# Patient Record
Sex: Male | Born: 1955 | Race: White | Hispanic: No | Marital: Single
Health system: Midwestern US, Academic
[De-identification: ages and names within clinical notes are randomized; demographics above are authoritative.]

---

## 2021-01-30 ENCOUNTER — Encounter: Admit: 2021-01-30 | Discharge: 2021-01-30 | Payer: Commercial Managed Care - PPO

## 2021-01-30 DIAGNOSIS — I350 Nonrheumatic aortic (valve) stenosis: Secondary | ICD-10-CM

## 2021-02-01 ENCOUNTER — Encounter: Admit: 2021-02-01 | Discharge: 2021-02-01 | Payer: MEDICARE

## 2021-02-01 DIAGNOSIS — E669 Obesity, unspecified: Secondary | ICD-10-CM

## 2021-02-01 DIAGNOSIS — E039 Hypothyroidism, unspecified: Secondary | ICD-10-CM

## 2021-02-01 DIAGNOSIS — I350 Nonrheumatic aortic (valve) stenosis: Secondary | ICD-10-CM

## 2021-02-01 DIAGNOSIS — I10 Essential (primary) hypertension: Secondary | ICD-10-CM

## 2021-02-01 DIAGNOSIS — I2510 Atherosclerotic heart disease of native coronary artery without angina pectoris: Secondary | ICD-10-CM

## 2021-02-06 ENCOUNTER — Ambulatory Visit: Admit: 2021-02-06 | Discharge: 2021-02-06 | Payer: MEDICARE

## 2021-02-06 ENCOUNTER — Encounter: Admit: 2021-02-06 | Discharge: 2021-02-06 | Payer: MEDICARE

## 2021-02-06 DIAGNOSIS — I2510 Atherosclerotic heart disease of native coronary artery without angina pectoris: Secondary | ICD-10-CM

## 2021-02-06 DIAGNOSIS — I10 Essential (primary) hypertension: Secondary | ICD-10-CM

## 2021-02-06 DIAGNOSIS — I350 Nonrheumatic aortic (valve) stenosis: Secondary | ICD-10-CM

## 2021-02-06 DIAGNOSIS — E669 Obesity, unspecified: Secondary | ICD-10-CM

## 2021-02-06 DIAGNOSIS — E039 Hypothyroidism, unspecified: Secondary | ICD-10-CM

## 2021-02-06 DIAGNOSIS — Z136 Encounter for screening for cardiovascular disorders: Secondary | ICD-10-CM

## 2021-02-06 LAB — POC CREATININE, RAD: CREATININE, POC: 1.1 mg/dL (ref 0.4–1.24)

## 2021-02-06 MED ORDER — IOHEXOL 350 MG IODINE/ML IV SOLN
100 mL | Freq: Once | INTRAVENOUS | 0 refills | Status: CP
Start: 2021-02-06 — End: ?
  Administered 2021-02-06: 16:00:00 100 mL via INTRAVENOUS

## 2021-02-06 MED ORDER — SODIUM CHLORIDE 0.9 % IJ SOLN
50 mL | Freq: Once | INTRAVENOUS | 0 refills | Status: CP
Start: 2021-02-06 — End: ?
  Administered 2021-02-06: 16:00:00 50 mL via INTRAVENOUS

## 2021-02-06 NOTE — Progress Notes
Date of Service: 02/06/2021       Subjective:             Robert Butler is a 66 y.o. male.      History of Present Illness  We had the pleasure of seeing Robert Butler in the valve clinic for further management of aortic stenosis and coronary artery disease.  He is a 66 year old male who follows with outside cardiologist who was noted to have progressive aortic stenosis.  He also has known hypertension, coronary artery disease, and obesity.  As part of his work-up for aortic stenosis he underwent echo Doppler on 12/01/2020 which revealed an EF of 70%, severe aortic stenosis with a mean gradient 61 mmHg, AVA 0.53, peak velocity 5.23 m/s with mild to moderate mitral regurgitation.  He was referred for TEE which was completed on 11/30 and revealed severe aortic stenosis with a valve area 0.75 cm?, moderate mitral regurgitation with preserved LV function of 70%.  Heart catheterization was also completed on 11/30 which revealed multivessel coronary artery disease including 70% mid RCA disease, 70 to 80% circumflex disease and 90% LAD disease.  He was evaluated by outside CTS surgeon who recommended surgical aortic valve replacement and CABG and he presents to [facility] for second opinion.    Today he reports that he notes chest tightness with exertion which typically resolves with rest.  He also notes some mild shortness of breath with exertion but feels the symptoms may be more noticeable since his recent diagnosis.  He denies orthopnea, PND, palpitations, dizziness, lightheadedness, or near syncope.  He admits that he is not overly active but does still do some activities like [place] as well as fishing.  He also reports that he retired about 1 year ago and does spend a lot of time with his grandchildren.       Review of Systems   Constitutional: Negative.   HENT: Negative.    Eyes: Negative.    Cardiovascular: Negative.    Respiratory: Positive for shortness of breath (Upon exertion ).    Endocrine: Negative.    Hematologic/Lymphatic: Negative.    Skin: Negative.    Musculoskeletal: Negative.    Gastrointestinal: Negative.    Genitourinary: Negative.    Neurological: Negative.    Psychiatric/Behavioral: Negative.    Allergic/Immunologic: Negative.          Objective:         ? amLODIPine (NORVASC) 5 mg tablet Take 5 mg by mouth daily.   ? aspirin EC 81 mg tablet Take 81 mg by mouth daily. Take with food.   ? CHOLEcalciferoL (vitamin D3) (VITAMIN D3) 50 mcg (2,000 unit) tablet Take 2,000 Units by mouth daily.   ? hydroCHLOROthiazide (HYDRODIURIL) 25 mg tablet Take 25 mg by mouth every morning.   ? levothyroxine (SYNTHROID) 50 mcg tablet Take 50 mcg by mouth daily 30 minutes before breakfast.   ? metoprolol succinate XL (TOPROL XL) 50 mg extended release tablet Take 50 mg by mouth daily.   ? multivitamin/iron/folic acid (CENTRUM COMPLETE PO) Take  by mouth daily.   ? rosuvastatin (CRESTOR) 20 mg tablet Take 20 mg by mouth daily.     Vitals:    02/06/21 1038   BP: 130/84   BP Source: Arm, Right Upper   Pulse: 72   SpO2: 96%   PainSc: Zero   Weight: 136.1 kg (300 lb)   Height: 177.8 cm (5' 10)     Body mass index is  43.05 kg/m?Marland Kitchen     Medical History:   Diagnosis Date   ? Aortic stenosis    ? Coronary artery disease    ? Hypertension    ? Hypothyroidism    ? Obesity      Surgical History:   Procedure Laterality Date   ? FINGER SURGERY       History reviewed. No pertinent family history.  Social History     Socioeconomic History   ? Marital status: Single   Tobacco Use   ? Smoking status: Never   ? Smokeless tobacco: Never   Substance and Sexual Activity   ? Alcohol use: Yes   ? Drug use: Never                   Physical Exam  General Appearance: no acute distress  Skin: warm & intact  HEENT: unremarkable  Neck Veins: neck veins are flat & not distended  Carotid Arteries: no bruits  Chest Inspection: chest is normal in appearance  Auscultation/Percussion: lungs clear to auscultation, no rales, rhonchi, or wheezing  Cardiac Rhythm: regular rhythm & normal rate  Cardiac Auscultation: Normal S1 & S2, no S3 or S4, no rub  Murmurs: 3/6 systolic murmur  Extremities: no lower extremity edema  Abdominal Exam: soft, non-tender, no masses, bowel sounds normal  Liver & Spleen: no organomegaly  Neurologic Exam: oriented to time, place and person; no focal neurologic deficits  Psychiatric: Normal mood and affect.  Behavior is normal. Judgment and thought content normal.            Assessment and Plan:  Severe aortic stenosis  His imaging was reviewed by Dr. Paris Lore and Dr. Helen Hashimoto.  He does have evidence of severe aortic stenosis.  They feel given his combined severe aortic stenosis and significant coronary disease he would be better served with open surgical aortic valve replacement and CABG.    Coronary artery disease  Recent cath revealed multivessel coronary artery disease.  As above would recommend to proceed with open AVR and CABG.    Chronic diastolic heart failure  He appears well compensated today on exam.  He describes NYHA class II heart failure symptoms.    Hypertension    Class III severe obesity    We appreciate the opportunity to participate in his care.    Utilizing a shared decision-making process, we discussed the nature and progression of valvular heart disease, goals of care, and treatment options including open surgery, transcatheter approach and conservative management and the risks and benefits of each option.      Dorena Cookey, APRN  Pager [phone]  ____________________________________________________________  I had a good conversation with Robert Butler in the valve clinic today concerning his aortic stenosis and multivessel coronary disease.  He is a 66 year old gentleman seeking second opinion from [place] area after being diagnosed with severe aortic stenosis.  He has a bicuspid aortic valve with an EF of 70% and a mean gradient of 61 with a valve area 1.53 cm?.  His peak velocity is 5.2 m/s.    On cath he has multivessel coronary disease with a 70% mid RCA, 80% OM1 lesion and a 90% LAD.  He was recommended open surgery therefore it was seeking second opinion for possible TAVR PCI.    I reviewed the films with Dr. Paris Lore and discussed extensively with the patient.  We did offer him both options.  We do feel that given the data for long-term outcomes with multivessel coronary disease that  PCI TAVR was slightly inferior to open surgical valve replacement and coronary bypass surgery.  We discussed valve choices and recommended a tissue valve and he agrees.  We reviewed the risks of the procedure including bleeding, infection, stroke, dialysis and death.  He has expressed understanding and is willing to proceed.  We are obtaining the remainder of his preoperative studies and working on getting him scheduled for an open procedure.  We also discussed ligating his left atrial appendage at that time and he is comfortable with that.  We will keep you updated on his progress.    Sindy Messing, M.D.

## 2021-02-07 ENCOUNTER — Encounter: Admit: 2021-02-07 | Discharge: 2021-02-07 | Payer: MEDICARE

## 2021-02-07 DIAGNOSIS — Q231 Congenital insufficiency of aortic valve: Secondary | ICD-10-CM

## 2021-02-07 DIAGNOSIS — I350 Nonrheumatic aortic (valve) stenosis: Secondary | ICD-10-CM

## 2021-02-07 DIAGNOSIS — I2510 Atherosclerotic heart disease of native coronary artery without angina pectoris: Secondary | ICD-10-CM

## 2021-02-08 ENCOUNTER — Encounter: Admit: 2021-02-08 | Discharge: 2021-02-08 | Payer: MEDICARE

## 2021-02-08 ENCOUNTER — Inpatient Hospital Stay: Admit: 2021-02-08 | Discharge: 2021-02-08 | Payer: MEDICARE

## 2021-02-08 DIAGNOSIS — I350 Nonrheumatic aortic (valve) stenosis: Secondary | ICD-10-CM

## 2021-02-08 DIAGNOSIS — I2510 Atherosclerotic heart disease of native coronary artery without angina pectoris: Secondary | ICD-10-CM

## 2021-02-08 DIAGNOSIS — Q231 Congenital insufficiency of aortic valve: Secondary | ICD-10-CM

## 2021-02-08 MED ORDER — METOPROLOL TARTRATE 25 MG PO TAB
12.5 mg | Freq: Once | ORAL | 0 refills
Start: 2021-02-08 — End: ?

## 2021-02-08 MED ORDER — SODIUM CHLORIDE 0.9 % IV SOLP
INTRAVENOUS | 0 refills
Start: 2021-02-08 — End: ?

## 2021-02-08 MED ORDER — LIDOCAINE (PF) 10 MG/ML (1 %) IJ SOLN
.2 mL | INTRAMUSCULAR | 0 refills | PRN
Start: 2021-02-08 — End: ?

## 2021-02-09 ENCOUNTER — Encounter: Admit: 2021-02-09 | Discharge: 2021-02-09 | Payer: MEDICARE

## 2021-02-09 NOTE — Telephone Encounter
Called patient to discuss PCI TAVR. Patient initially stated he would need to wait until after the SuperBowl to have PCI. He asked that I call Monday so he can think about dates.

## 2021-02-12 ENCOUNTER — Encounter: Admit: 2021-02-12 | Discharge: 2021-02-12 | Payer: MEDICARE

## 2021-02-12 DIAGNOSIS — I2510 Atherosclerotic heart disease of native coronary artery without angina pectoris: Secondary | ICD-10-CM

## 2021-02-12 DIAGNOSIS — I350 Nonrheumatic aortic (valve) stenosis: Secondary | ICD-10-CM

## 2021-02-12 MED ORDER — ACETAMINOPHEN 325 MG PO TAB
650 mg | ORAL | 0 refills | PRN
Start: 2021-02-12 — End: ?

## 2021-02-12 MED ORDER — NITROGLYCERIN 0.4 MG SL SUBL
.4 mg | SUBLINGUAL | 0 refills | PRN
Start: 2021-02-12 — End: ?

## 2021-02-12 MED ORDER — ALUMINUM-MAGNESIUM HYDROXIDE 200-200 MG/5 ML PO SUSP
30 mL | ORAL | 0 refills | PRN
Start: 2021-02-12 — End: ?

## 2021-02-12 MED ORDER — ASPIRIN 325 MG PO TAB
325 mg | Freq: Once | ORAL | 0 refills
Start: 2021-02-12 — End: ?

## 2021-02-12 MED ORDER — DOCUSATE SODIUM 100 MG PO CAP
100 mg | Freq: Every day | ORAL | 0 refills | PRN
Start: 2021-02-12 — End: ?

## 2021-02-12 MED ORDER — TEMAZEPAM 15 MG PO CAP
15 mg | Freq: Every evening | ORAL | 0 refills | PRN
Start: 2021-02-12 — End: ?

## 2021-02-12 NOTE — Progress Notes
CBC & BMP orders faxed to [facility] at [phone].

## 2021-02-12 NOTE — Patient Instructions
CARDIAC CATHETERIZATION   PRE-ADMISSION INSTRUCTIONS    Patient Name: Robert Butler  MRN#: [id number]  Date of Birth: 10/06/1955 (66 y.o.)  Today's Date: 02/12/2021    PROCEDURE:  You are scheduled for a Coronary Angiogram with possible Angioplasty/Stenting with Dr. Idamae Lusher. Please plan on a possible overnight stay in the hospital.       PROCEDURE DATE AND ARRIVAL TIME:  Your procedure date is 03/14/21.   Please check in to the Cardiovascular Medicine office for an updated history & physical visit with Dorena Cookey, APRN at 8:00 a.m.    You will have Pulmonary Function Test at 9:00 a.m. in the Medical Office [place].    You will receive a call from the Cath lab staff between 8:00 a.m. and noon on the business day prior to your procedure to let you know at what time to arrive on the day of your procedure.    Please check in at the Admitting Desk in the [facility] for your procedure. [facility] Entrance and and take a right. Continue down the hallway past the Cardiovascular Medicine office. That hall will take you into the [facility]. Check in at the desk on the left side.)     (If you have further questions regarding your arrival time for the CV lab, please call [phone] by 3:00pm the day before your procedure. Please leave a message with your name and number, your call will be returned in a timely manner.)    PRE-PROCEDURE APPOINTMENTS:    03/14/21 at 8:00   Office visit to update history and physical (requirement within 30 days of procedure)  with  Dorena Cookey, APRN  at Cardiovascular Medicine [facility]       Between 02/28/21 - 03/07/21    Pre-Admission lab work required within 14 days of procedure: BMP and CBC  [facility]       FOOD AND DRINK INSTRUCTIONS  Nothing to eat after midnight before your procedure. No caffeine for 24 hours prior to your procedure. You will be under moderate sedation for your procedure.  You may drink clear liquids up to an hour before hospital arrival. This will be confirmed by the Cath lab staff the day before your procedure.     SPECIAL MEDICATION INSTRUCTIONS  Any new prescriptions will be sent to your pharmacy listed on file with [place].     Please either take 4 baby aspirins (4 times 81mg ) or one full strength 325mg  aspirin the morning of your procedure.    Diuretics: HCTZ (hydrochlorothiazide) -- hold the morning of your procedure.      HOLD ALL erectile dysfunction medications for 3 days, unless prescribed for pulmonary hypertension.    HOLD ALL over the counter vitamins or supplements on the morning of your procedure.      Additional Instructions  If you wear CPAP, please bring your mask and machine with you to the hospital.    Take a bath or shower with anti-bacterial soap the evening before, or the morning of the procedure.     Bring photo ID and your health insurance card(s).    Arrange for a driver to take you home from the hospital. Please arrange for a friend or family member to take you home from this test. You cannot take a Taxi, [employer] Goad, or public transportation as there has to be a responsible person to help care for you after sedation    Bring an accurate list of your current medications with you  to the hospital (all medications and supplements taken daily).  Please use the medication list below and write in the date and time when you took your last dose before your procedure. Update this list of medications as needed.      Wear comfortable clothes and don't bring valuables, other than photo identification card, with you to the hospital.    Please pack a bag for an overnight stay.     Please review your pre-procedure instructions and bring them with you on the day of your procedure.    Please reach out to valve clinic nurses, Aletheia Tangredi or [place] with questions: [phone].    ALLERGIES  No Known Allergies    CURRENT MEDICATIONS  Outpatient Encounter Medications as of 02/12/2021   Medication Sig Dispense Refill    amLODIPine (NORVASC) 5 mg tablet Take 5 mg by mouth daily.      aspirin EC 81 mg tablet Take 81 mg by mouth daily. Take with food.      CHOLEcalciferoL (vitamin D3) (VITAMIN D3) 50 mcg (2,000 unit) tablet Take 2,000 Units by mouth daily.      hydroCHLOROthiazide (HYDRODIURIL) 25 mg tablet Take 25 mg by mouth every morning.      levothyroxine (SYNTHROID) 50 mcg tablet Take 50 mcg by mouth daily 30 minutes before breakfast.      metoprolol succinate XL (TOPROL XL) 50 mg extended release tablet Take 50 mg by mouth daily.      multivitamin/iron/folic acid (CENTRUM COMPLETE PO) Take  by mouth daily.      rosuvastatin (CRESTOR) 20 mg tablet Take 20 mg by mouth daily.       No facility-administered encounter medications on file as of 02/12/2021.       _________________________________________  Form completed by: Cameron Sprang, RN  Date completed: 02/12/21  Method: Via telephone and mailed to the patient.        Coronary Angiography  Angiography is a special type of moving X-ray that lets your doctor view your coronary arteries to see if the blood vessels to your heart are narrowed or blocked. This test is done when someone is having a heart attack. Or it may be done if symptoms may mean a heart attack. It also may be done after an abnormal cardiac stress test.    Before the procedure   Tell your healthcare team what medicines you take and any allergies you may have.  Tell your healthcare team if you've had a reaction to contrast dye or have had any kidney problems.  Follow any directions you are given for not eating or drinking before surgery.  A nurse will place an IV (intravenous) catheter in your vein to give fluids, and medicine to relieve pain and help you feel less anxious.  They clean your skin and shave the area where the catheter will be inserted, if needed.    During the procedure   You will lie on a table with a portable X-ray machine over you. The team will place a surgical drape over your body. The area where the doctor chooses to insert the catheter will be cleaned. This will be either a wrist or the groin.  Your doctor will place a long, thin tube (catheter) inside an artery in your groin or arm and guide it into your heart. You may feel pressure with the insertion of the catheter. A numbing medicine often is injected at the insertion site. This eases discomfort during the procedure.  They will inject a contrast  dye through the catheter into your blood vessels or heart chambers. You may feel a warm sensation or feeling like you have to urinate when the contrast is injected. This is normal.  X-rays are taken to show images of the inside of your heart and coronary arteries.     The catheter can be placed into the groin, arm, or wrist.     After the procedure   Your healthcare team will tell you how long to lie down and keep the insertion site still. The amount of time may depend on whether a closure device such as a stitch or collagen plug was used to close the opening made in your artery. The time you must be still may be shorter if one of these devices was used. The amount of time will also depend on if there is any bleeding at the catheter insertion site.  If the insertion site was in your groin, you may need to lie down with your leg still for several hours. If the insertion site was in your wrist, a pressure bandage may be put on the site. Or you may have closure device placed on the insertion site. It will be taken off when there is no sign of bleeding. If bleeding occurs, a nurse will put pressure on the area to control it.  A nurse will check your blood pressure and the insertion site often. This is to make sure you remain stable after the procedure.  You may be asked to drink fluid to help flush the contrast liquid out of your system.  Have someone drive you home from the hospital.  If your doctor uses angioplasty or a stent to treat a blocked artery, you may stay the night in the hospital. If there are multiple blockages that can't be fixed with a stent or angioplasty, you may need surgery to bypass the blockages. This is called coronary artery bypass graft surgery. Your doctor will explain the results of your test and what treatment options that may be best for you.  It?s normal to find a small bruise or lump at the insertion site. The lump may be the collagen plug or stitch that you feel, or a small bruise. These common side effects should disappear within a few weeks.  You will be given instructions by your healthcare team on recovering from the coronary angiography. In general, don't lift anything heavier than a gallon of milk for several days. This gives time for the puncture site in the artery wall to heal. Try not to get the puncture site wet. Don't put it under water. Showers are OK. Don't soak in a bathtub, swimming pool, or hot tub until the skin has healed.    When to call your healthcare provider   Call your healthcare provider right away if you have any of these:   Symptoms of infection. These include pain, swelling, redness, bleeding, or drainage at the insertion site.  Fever of 100.4?F (38?C) or higher, or as advised by your provider  Bleeding, bruising, or a lot of swelling where the catheter was inserted  Blood in your urine  Black or tarry stools  Any unusual bleeding  Irregular, very slow, or fast heartbeat  Dizziness  Call 911  Call 911if any of these occur:     Chest pain  Shortness of breath  Sudden numbness or weakness in arms, legs, or face, or difficulty speaking  The puncture site swells up very fast  Bleeding from the puncture site that  does not slow down with firm pressure  Severe or increasing pain, numbness, coldness, or a bluish color in the leg or arm that held the catheter    [employer] last reviewed this educational content on 02/22/2020  ? 2000-2022 [employer], [place]. All rights reserved. This information is not intended as a substitute for professional medical care. Always follow your healthcare professional's instructions.

## 2021-02-14 ENCOUNTER — Encounter: Admit: 2021-02-14 | Discharge: 2021-02-14 | Payer: MEDICARE

## 2021-02-14 NOTE — Progress Notes
Medicare Primary No pre-certification is required.

## 2021-02-19 ENCOUNTER — Encounter: Admit: 2021-02-19 | Discharge: 2021-02-19 | Payer: MEDICARE

## 2021-02-27 ENCOUNTER — Encounter: Admit: 2021-02-27 | Discharge: 2021-02-27 | Payer: MEDICARE

## 2021-02-27 NOTE — Progress Notes
Letter of dental clearance received and sent to medical records to be scanned to patient's chart.

## 2021-03-02 ENCOUNTER — Encounter: Admit: 2021-03-02 | Discharge: 2021-03-02 | Payer: MEDICARE

## 2021-03-02 NOTE — Progress Notes
Outside PFT report received and sent to medical records, with copy to J. McClymont, APRN, to be scanned to patient's chart.

## 2021-03-13 ENCOUNTER — Encounter: Admit: 2021-03-13 | Discharge: 2021-03-13 | Payer: MEDICARE

## 2021-03-13 NOTE — Telephone Encounter
RN called pt and asked if he could have BMP and CBC drawn today as he has a cath scheduled for tomorrow 2/22.     Pt reports he is able to go to [facility] today to have drawn.    Tanya Crothers,RN

## 2021-03-14 ENCOUNTER — Encounter: Admit: 2021-03-14 | Discharge: 2021-03-14 | Payer: MEDICARE

## 2021-03-14 ENCOUNTER — Ambulatory Visit: Admit: 2021-03-14 | Discharge: 2021-03-14 | Payer: MEDICARE

## 2021-03-14 DIAGNOSIS — E669 Obesity, unspecified: Secondary | ICD-10-CM

## 2021-03-14 DIAGNOSIS — I10 Essential (primary) hypertension: Secondary | ICD-10-CM

## 2021-03-14 DIAGNOSIS — I2510 Atherosclerotic heart disease of native coronary artery without angina pectoris: Secondary | ICD-10-CM

## 2021-03-14 DIAGNOSIS — I350 Nonrheumatic aortic (valve) stenosis: Secondary | ICD-10-CM

## 2021-03-14 DIAGNOSIS — E039 Hypothyroidism, unspecified: Secondary | ICD-10-CM

## 2021-03-14 DIAGNOSIS — Z01810 Encounter for preprocedural cardiovascular examination: Secondary | ICD-10-CM

## 2021-03-14 MED ADMIN — ACETAMINOPHEN 325 MG PO TAB [101]: 650 mg | ORAL | @ 20:00:00 | NDC 00904677361

## 2021-03-14 MED ADMIN — SODIUM CHLORIDE 0.9 % IV SOLP [27838]: 500 mL | INTRAVENOUS | @ 16:00:00 | Stop: 2021-03-14 | NDC 00338004904

## 2021-03-14 MED ADMIN — SODIUM CHLORIDE 0.9 % IV SOLP [27838]: 1000.000 mL | INTRAVENOUS | @ 20:00:00 | Stop: 2021-03-16 | NDC 00338004904

## 2021-03-14 MED ADMIN — ASPIRIN 81 MG PO CHEW [680]: 81 mg | ORAL | @ 16:00:00 | Stop: 2021-03-14 | NDC 16103036611

## 2021-03-14 MED ADMIN — HYDROCHLOROTHIAZIDE 12.5 MG PO TAB [136518]: 12.5 mg | ORAL | @ 20:00:00 | NDC 69315015501

## 2021-03-14 MED ADMIN — AMLODIPINE 5 MG PO TAB [79041]: 5 mg | ORAL | @ 20:00:00 | NDC 00904637061

## 2021-03-14 MED ADMIN — METOPROLOL SUCCINATE 50 MG PO TB24 [77931]: 50 mg | ORAL | @ 20:00:00 | NDC 00904632361

## 2021-03-14 NOTE — Patient Instructions
You will proceed with stent procedure.

## 2021-03-14 NOTE — Progress Notes
Date of Service: 03/14/2021    Robert Butler is a 66 y.o. male.       HPI     I had the pleasure of seeing Robert Butler for preseizure history and physical prior to staged PCI.  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 He also has known hypertension, coronary artery disease, and obesity.  As part of his work-up for aortic stenosis he underwent echo Doppler on 12/01/2020 which revealed an EF of 70%, severe aortic stenosis with a mean gradient 61 mmHg, AVA 0.53, peak velocity 5.23 m/s with mild to moderate mitral regurgitation.  He was referred for TEE which was completed on 11/30 and revealed severe aortic stenosis with a valve area 0.75 cm?, moderate mitral regurgitation with preserved LV function of 70%.  Heart catheterization was also completed on 11/30 which revealed multivessel coronary artery disease including 70% mid RCA disease, 70 to 80% circumflex disease and 90% LAD disease.  He was evaluated by outside CTS surgeon who recommended surgical aortic valve replacement and CABG and he presented to [place] for second opinion.  ?  He was evaluated by Dr. Paris Lore and Dr. Helen Hashimoto and initially had opted for open AVR and CABG however the patient changed his mind and would prefer TAVR with PCI.  He presents today for staged PCI.  Today he reports that he continues to note shortness of breath with stairs and longer distances.  He was lifting a 50 pound bag of feed a few days ago and climbing stairs and developed midsternal chest tightness.  This resolved with rest.  He denies palpitations, dizziness, lightheadedness, or near syncope.  He denies orthopnea, PND, or progressive lower extremity edema.    TAVR CTAs completed 02/06/2021:  CHEST:   1. ?Aortic valve calcification with mild dilatation of the aortic root and   ascending thoracic aorta.   2. ?Upper normal size heart with coronary artery calcification.     ABDOMEN AND PELVIS:   1. ?Normal caliber abdominal aorta and iliac arteries with mild   atherosclerotic plaque.   2. 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            Vitals:    03/14/21 0814   BP: 130/70   BP Source: Arm, Right Upper   Pulse: 52   PainSc: Zero   Weight: (!) 137 kg (302 lb)   Height: 177.8 cm (5' 10)     Body mass index is 43.33 kg/m?Marland Kitchen     Past Medical History  Patient Active Problem List    Diagnosis Date Noted   ? Coronary artery disease 02/01/2021   ? Hypertension 02/01/2021   ? Obesity 02/01/2021   ? Aortic stenosis 01/30/2021         Review of Systems   Constitutional: Negative.   HENT: Negative.    Eyes: Negative.    Cardiovascular:        Chest Pressure with exertion   Respiratory: Negative.    Endocrine: Negative.    Hematologic/Lymphatic: Negative.    Skin: Negative.    Musculoskeletal: Negative.    Gastrointestinal: Negative.    Genitourinary: Negative.    Neurological: Negative.    Psychiatric/Behavioral: Negative.    All other systems reviewed and are negative.      Physical Exam  General Appearance: no acute distress  Skin: warm & intact  HEENT: unremarkable  Neck Veins: neck veins are  flat & not distended  Chest Inspection: chest is normal in appearance  Auscultation/Percussion: lungs clear to auscultation, no rales, rhonchi, or wheezing  Cardiac Rhythm: regular rhythm & normal rate  Cardiac Auscultation: Normal S1 & S2, no S3 or S4, no rub  Murmurs: 3/6 systolic murmur  Extremities: no lower extremity edema; 2+ symmetric distal pulses  Abdominal Exam: soft, non-tender, no masses, bowel sounds normal  Liver & Spleen: no organomegaly  Neurologic Exam: oriented to time, place and person; no focal neurologic deficits  Psychiatric: Normal mood and affect.  Behavior is normal. Judgment and thought content normal.         Cardiovascular Studies  Preliminary EKG: Sinus bradycardia, ventricular rate 52    Cardiovascular Health Factors  Vitals BP Readings from Last 3 Encounters: 03/14/21 130/70   02/06/21 134/84   02/06/21 130/84     Wt Readings from Last 3 Encounters:   03/14/21 (!) 137 kg (302 lb)   02/06/21 136.1 kg (300 lb)   02/06/21 136.1 kg (300 lb)     BMI Readings from Last 3 Encounters:   03/14/21 43.33 kg/m?   02/06/21 43.05 kg/m?   02/06/21 43.05 kg/m?      Smoking Social History     Tobacco Use   Smoking Status Never   Smokeless Tobacco Never      Lipid Profile No results found for: CHOL  No results found for: HDL  No results found for: LDL  No results found for: TRIG   Blood Sugar No results found for: HGBA1C  No results found for: GLU, GLUF, GLUPOC       Problems Addressed Today  Encounter Diagnoses   Name Primary?   ? Aortic valve stenosis, etiology of cardiac valve disease unspecified Yes   ? Preop cardiovascular exam    ? Coronary artery disease involving native coronary artery of native heart without angina pectoris    ? Hypertension, unspecified type    ? Class 3 severe obesity due to excess calories without serious comorbidity with body mass index (BMI) of 40.0 to 44.9 in adult [facility])        Assessment and Plan     Severe aortic stenosis  His imaging was reviewed by Dr. Paris Lore and Dr. Helen Hashimoto. He has been deemed a candidate for TAVR and presents today for coronary angiogram and staged PCI.  The procedure was reviewed with him in detail and all of his questions were answered.  He will complete PFTs and carotid ultrasound which will complete his TAVR work-up.    Coronary artery disease  Recent cath revealed multivessel coronary artery disease. He is scheduled for staged PCI today.  ?  Chronic diastolic heart failure  He appears well compensated today on exam.  He describes NYHA class II heart failure symptoms.  ?  Hypertension  His blood pressure is optimally controlled on his current regimen.    Class III severe obesity    I appreciate the opportunity to participate in his care.    Dorena Cookey, APRN  Pager [phone]             Current Medications (including today's revisions)  ? amLODIPine (NORVASC) 5 mg tablet Take one tablet by mouth daily.   ? aspirin EC 81 mg tablet Take one tablet by mouth daily. Take with food.   ? CHOLEcalciferoL (vitamin D3) (VITAMIN D3) 50 mcg (2,000 unit) tablet Take one tablet by mouth daily.   ? hydroCHLOROthiazide (HYDRODIURIL) 25 mg tablet Take one tablet by  mouth every morning.   ? levothyroxine (SYNTHROID) 50 mcg tablet Take one tablet by mouth daily 30 minutes before breakfast.   ? metoprolol succinate XL (TOPROL XL) 50 mg extended release tablet Take one tablet by mouth daily.   ? multivitamin/iron/folic acid (CENTRUM COMPLETE PO) Take  by mouth daily.   ? rosuvastatin (CRESTOR) 20 mg tablet Take one tablet by mouth daily.

## 2021-03-15 ENCOUNTER — Encounter: Admit: 2021-03-15 | Discharge: 2021-03-15 | Payer: MEDICARE

## 2021-03-15 MED ADMIN — METOPROLOL SUCCINATE 50 MG PO TB24 [77931]: 50 mg | ORAL | @ 14:00:00 | Stop: 2021-03-15 | NDC 00904632361

## 2021-03-15 MED ADMIN — HYDROCHLOROTHIAZIDE 12.5 MG PO TAB [136518]: 12.5 mg | ORAL | @ 14:00:00 | Stop: 2021-03-15 | NDC 69315015501

## 2021-03-15 MED ADMIN — AMLODIPINE 5 MG PO TAB [79041]: 5 mg | ORAL | @ 14:00:00 | Stop: 2021-03-15 | NDC 00904637061

## 2021-03-15 MED ADMIN — CLOPIDOGREL 75 MG PO TAB [78966]: 75 mg | ORAL | @ 14:00:00 | Stop: 2021-03-15 | NDC 00904629461

## 2021-03-15 MED ADMIN — ROSUVASTATIN 20 MG PO TAB [88504]: 20 mg | ORAL | @ 03:00:00 | NDC 68462026390

## 2021-03-15 MED ADMIN — ASPIRIN 81 MG PO CHEW [680]: 81 mg | ORAL | @ 14:00:00 | Stop: 2021-03-15 | NDC 16103036611

## 2021-03-15 MED ADMIN — LEVOTHYROXINE 50 MCG PO TAB [4421]: 50 ug | ORAL | @ 12:00:00 | Stop: 2021-03-15 | NDC 00904695061

## 2021-03-15 MED FILL — CLOPIDOGREL 75 MG PO TAB: 75 mg | ORAL | 30 days supply | Qty: 30 | Fill #1 | Status: CP

## 2021-03-16 ENCOUNTER — Inpatient Hospital Stay: Admit: 2021-03-16 | Discharge: 2021-03-16 | Payer: MEDICARE

## 2021-03-16 ENCOUNTER — Encounter: Admit: 2021-03-16 | Discharge: 2021-03-16 | Payer: MEDICARE

## 2021-03-16 DIAGNOSIS — I350 Nonrheumatic aortic (valve) stenosis: Secondary | ICD-10-CM

## 2021-03-16 NOTE — Telephone Encounter
-----   Message from Dyanne Iha, APRN-NP sent at 03/14/2021  2:35 PM CST -----  Yes please    ----- Message -----  From: Lauraine Rinne, RN  Sent: 03/14/2021   1:46 PM CST  To: Harriet Masson McClymont, APRN-NP, #    Patient got PCI to RCA & LAD today. He will hopefully get PFT's before he leaves tomorrow. All of his other work up is completed. Ok to schedule for TAVR?

## 2021-03-16 NOTE — Telephone Encounter
Spoke to patient and scheduled TAVR and preop appointments.

## 2021-03-30 ENCOUNTER — Encounter: Admit: 2021-03-30 | Discharge: 2021-03-30 | Payer: MEDICARE

## 2021-03-30 DIAGNOSIS — I350 Nonrheumatic aortic (valve) stenosis: Secondary | ICD-10-CM

## 2021-03-30 MED ORDER — SODIUM CHLORIDE 0.9 % IV SOLP
INTRAVENOUS | 0 refills
Start: 2021-03-30 — End: ?

## 2021-03-30 MED ORDER — ASPIRIN 81 MG PO TBEC
81 mg | Freq: Once | ORAL | 0 refills
Start: 2021-03-30 — End: ?

## 2021-03-30 MED ORDER — LIDOCAINE (PF) 10 MG/ML (1 %) IJ SOLN
.2 mL | INTRAMUSCULAR | 0 refills | PRN
Start: 2021-03-30 — End: ?

## 2021-04-02 ENCOUNTER — Encounter: Admit: 2021-04-02 | Discharge: 2021-04-02 | Payer: MEDICARE

## 2021-04-03 ENCOUNTER — Encounter: Admit: 2021-04-03 | Discharge: 2021-04-03 | Payer: MEDICARE

## 2021-04-03 NOTE — Telephone Encounter
Patient left VM he has a chest cold and needs to reschedule his preop appointments & TAVR. Returned call to patient and rescheduled.

## 2021-04-10 ENCOUNTER — Encounter: Admit: 2021-04-10 | Discharge: 2021-04-10 | Payer: MEDICARE

## 2021-04-10 NOTE — Telephone Encounter
Received voicemail from patient questioning if it's ok for him to proceed with TAVR due to cough. Called & spoke to patient who reports he has had a cough since 3/11. Patient states his cough was previously productive, but is now dry. Patient denies fever. Patient states his only other symptom previously was loss of appetite. Patient has not tested for Covid or seen his PCP. Discussed with Shela Commons McClymont, APRN. Recommendations received to have patient test for Covid & ok for patient to come to his appointments 3/22 if Covid test is negative. Called & spoke to patient to give him recommendations from J. McClymont, APRN. Patient communicates understanding & states he will take a Covid test tonight. Provided valve clinic nurses' direct phone number.

## 2021-04-11 ENCOUNTER — Ambulatory Visit: Admit: 2021-04-11 | Discharge: 2021-04-11 | Payer: MEDICARE

## 2021-04-11 ENCOUNTER — Encounter: Admit: 2021-04-11 | Discharge: 2021-04-11 | Payer: MEDICARE

## 2021-04-11 DIAGNOSIS — I350 Nonrheumatic aortic (valve) stenosis: Secondary | ICD-10-CM

## 2021-04-11 DIAGNOSIS — I2510 Atherosclerotic heart disease of native coronary artery without angina pectoris: Secondary | ICD-10-CM

## 2021-04-11 DIAGNOSIS — E039 Hypothyroidism, unspecified: Secondary | ICD-10-CM

## 2021-04-11 DIAGNOSIS — I5032 Chronic diastolic (congestive) heart failure: Secondary | ICD-10-CM

## 2021-04-11 DIAGNOSIS — Z8719 Personal history of other diseases of the digestive system: Secondary | ICD-10-CM

## 2021-04-11 DIAGNOSIS — E782 Mixed hyperlipidemia: Secondary | ICD-10-CM

## 2021-04-11 DIAGNOSIS — E669 Obesity, unspecified: Secondary | ICD-10-CM

## 2021-04-11 DIAGNOSIS — R0989 Other specified symptoms and signs involving the circulatory and respiratory systems: Secondary | ICD-10-CM

## 2021-04-11 DIAGNOSIS — I10 Essential (primary) hypertension: Secondary | ICD-10-CM

## 2021-04-11 DIAGNOSIS — R051 Acute cough: Secondary | ICD-10-CM

## 2021-04-11 DIAGNOSIS — Z20822 Encounter for screening laboratory testing for COVID-19 virus: Secondary | ICD-10-CM

## 2021-04-11 DIAGNOSIS — J3489 Other specified disorders of nose and nasal sinuses: Secondary | ICD-10-CM

## 2021-04-11 LAB — URINALYSIS DIPSTICK REFLEX TO CULTURE
GLUCOSE,UA: NEGATIVE
LEUKOCYTES: NEGATIVE
NITRITE: NEGATIVE
PROTEIN,UA: NEGATIVE
URINE ASCORBIC ACID, UA: NEGATIVE
URINE BILE: NEGATIVE
URINE BLOOD: NEGATIVE
URINE KETONE: NEGATIVE
URINE PH: 6 (ref 5.0–8.0)
URINE SPEC GRAVITY: 1 (ref 1.005–1.030)

## 2021-04-11 LAB — BNP (B-TYPE NATRIURETIC PEPTI): BNP: 291 pg/mL — ABNORMAL HIGH (ref 0–100)

## 2021-04-11 LAB — CBC
HEMATOCRIT: 41 % (ref 40–50)
HEMOGLOBIN: 13 g/dL (ref 13.5–16.5)
MPV: 8 FL (ref 7–11)
PLATELET COUNT: 210 K/UL (ref 150–400)
RBC COUNT: 4.9 M/UL (ref 4.4–5.5)
WBC COUNT: 10 K/UL (ref 4.5–11.0)

## 2021-04-11 LAB — COMPREHENSIVE METABOLIC PANEL
ALBUMIN: 4.5 g/dL (ref 3.5–5.0)
ALK PHOSPHATASE: 57 U/L (ref 25–110)
ALT: 11 U/L (ref 7–56)
ANION GAP: 8 (ref 3–12)
AST: 24 U/L (ref 7–40)
CO2: 29 MMOL/L (ref 21–30)
CREATININE: 1.1 mg/dL (ref 0.4–1.24)
EGFR: >60 mL/min (ref >60)
GLUCOSE,PANEL: 87 mg/dL (ref 70–100)
TOTAL BILIRUBIN: 0.5 mg/dL (ref 0.3–1.2)

## 2021-04-11 LAB — URINALYSIS MICROSCOPIC REFLEX TO CULTURE

## 2021-04-11 LAB — HEMOGLOBIN A1C: HEMOGLOBIN A1C: 5.8 % — ABNORMAL HIGH (ref 4.0–5.7)

## 2021-04-11 NOTE — Progress Notes
Date of Service: 04/11/2021    Robert Butler is a 66 y.o. male.       HPI     I had the pleasure of seeing Robert Butler for preseizure history and physical prior to TAVR.  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 He also has known hypertension, coronary artery disease, and obesity. ?As part of his work-up for aortic stenosis he underwent echo Doppler on 12/01/2020 which revealed an EF of 70%, severe aortic stenosis with a mean gradient 61 mmHg, AVA 0.53, peak velocity 5.23 m/s with mild to moderate mitral regurgitation. ?He was referred for TEE which was completed on 11/30 and revealed severe aortic stenosis with a valve area 0.75 cm?, moderate mitral regurgitation with preserved LV function of 70%. ?Heart catheterization was also completed on 11/30 which revealed multivessel coronary artery disease including 70% mid RCA disease, 70 to 80% circumflex disease and 90% LAD disease. ?He was evaluated by outside CTS surgeon who recommended surgical aortic valve replacement and CABG and he presented to [place] for second opinion.  ?  He was evaluated by Dr. Paris Lore and Dr. Helen Hashimoto and initially had opted for open AVR and CABG however the patient changed his mind and would prefer TAVR with PCI.  He underwent staged PCI on 2/22.     Cardiac cath/staged PCI: 2/22  Today he reports that he continues to note shortness of breath with stairs and longer distances.  He denies chest pain, palpitations, dizziness, lightheadedness, or near syncope.  He denies orthopnea, PND, or progressive lower extremity edema.  ?  PFTs: (Bedside) 2/22  FEV1: (61%)    Carotid ultrasound: 2/22  1. No hemodynamically significant (>50%) stenosis measured in the common and internal carotid arteries bilaterally.  2. There is normal antegrade flow in the bilateral vertebral arteries.  3. No evidence of proximal subclavian stenosis bilaterally.  ?  TAVR CTAs completed 02/06/2021:  CHEST: 1. ?Aortic valve calcification with mild dilatation of the aortic root and   ascending thoracic aorta.   2. ?Upper normal size heart with coronary artery calcification.     ABDOMEN AND PELVIS:   1. ?Normal caliber abdominal aorta and iliac arteries with mild   atherosclerotic plaque.   2. 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   ?  ?Today he reports that he continues to note shortness of breath with stairs and longer distances.  He denies chest pain, palpitations, dizziness, lightheadedness, or near syncope.  He denies orthopnea, PND, or progressive lower extremity edema.    STS risk assessment:  Isolated AVR  Risk of Mortality:  1.647%  Renal Failure:  4.972%  Permanent Stroke:  1.120%  Prolonged Ventilation:  9.046%  DSW Infection:  0.232%  Reoperation:  3.179%  Morbidity or Mortality:  14.140%  Short Length of Stay:  47.321%  Long Length of Stay:  5.676%           Vitals:    04/11/21 1344   BP: 132/78   BP Source: Arm, Left Upper   Pulse: 52   PainSc: Zero   Weight: 136.1 kg (300 lb)   Height: 177.8 cm (5' 10)     Body mass index is 43.05 kg/m?Marland Kitchen     Past Medical History  Patient Active Problem List    Diagnosis Date Noted   ? Stage 2 moderate COPD by GOLD classification (HCC) 04/02/2021   ? Chronic diastolic  heart failure, NYHA class 2 (HCC) 04/02/2021   ? 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 Coronary artery disease due to lipid rich plaque 03/14/2021   ? Coronary artery disease 02/01/2021   ? Hypertension 02/01/2021   ? Class 3 severe obesity with body mass index (BMI) of 40.0 to 44.9 in adult [facility]) 02/01/2021   ? 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   HENT: Negative.    Eyes: Negative.    Cardiovascular: Negative.    Respiratory: Negative.    Endocrine: Negative.    Hematologic/Lymphatic: Negative.    Skin: Negative.    Musculoskeletal: Negative.    Gastrointestinal: Negative.    Genitourinary: Negative.    Neurological: Negative.    Psychiatric/Behavioral: Negative.    All other systems reviewed and are negative.      Physical Exam  General Appearance: no acute distress  Skin: warm & intact  HEENT: unremarkable  Neck Veins: neck veins are flat & not distended  Chest Inspection: chest is normal in appearance  Auscultation/Percussion: lungs clear to auscultation, no rales, rhonchi, or wheezing  Cardiac Rhythm: regular rhythm & normal rate  Cardiac Auscultation: Normal S1 & S2, no S3 or S4, no rub  Murmurs: Harsh systolic murmur  Extremities: no lower extremity edema  Abdominal Exam: soft, non-tender, no masses, bowel sounds normal  Liver & Spleen: no organomegaly  Neurologic Exam: oriented to time, place and person; no focal neurologic deficits  Psychiatric: Normal mood and affect.  Behavior is normal. Judgment and thought content normal.         Cardiovascular Studies      Cardiovascular Health Factors  Vitals BP Readings from Last 3 Encounters:   04/11/21 132/78   04/11/21 132/79   03/15/21 120/74     Wt Readings from Last 3 Encounters:   04/11/21 136.1 kg (300 lb)   04/11/21 (!) 136.4 kg (300 lb 12.8 oz)   03/14/21 135.9 kg (299 lb 9.7 oz)     BMI Readings from Last 3 Encounters:   04/11/21 43.05 kg/m?   04/11/21 43.16 kg/m?   03/14/21 42.99 kg/m?      Smoking Social History     Tobacco Use   Smoking Status Never   Smokeless Tobacco Never      Lipid Profile Cholesterol   Date Value Ref Range Status   03/14/2021 137 <200 MG/DL Final     HDL   Date Value Ref Range Status   03/14/2021 43 >40 MG/DL Final     LDL   Date Value Ref Range Status   03/14/2021 71 <100 mg/dL Final     Triglycerides   Date Value Ref Range Status   03/14/2021 77 <150 MG/DL Final      Blood Sugar No results found for: HGBA1C  Glucose   Date Value Ref Range Status   04/11/2021 87 70 - 100 MG/DL Final   16/10/9602 93 70 - 100 MG/DL Final   54/09/8117 90 70 - 100 MG/DL Final          Problems Addressed Today  Encounter Diagnoses   Name Primary?   ? Nonrheumatic aortic valve stenosis Yes   ? Coronary artery disease involving native coronary artery of native heart without angina pectoris    ? Hypertension, unspecified type    ? Chronic diastolic heart failure, NYHA class 2 (HCC)    ? Mixed hyperlipidemia    ? Class 3 severe  obesity due to excess calories without serious comorbidity with body mass index (BMI) of 40.0 to 44.9 in adult [facility])        Assessment and Plan     Severe aortic stenosis  He has been deemed a candidate for TAVR by Dr. Paris Lore and Dr. Helen Hashimoto.  He is scheduled for procedure tomorrow on 3/23.  He has completed his preanesthesia visit.  All of his questions regarding procedure were answered today.  ?  Coronary artery disease  Recent cath revealed multivessel coronary artery disease.? He underwent staged PCI.  He is on aspirin and Plavix.  ?  Chronic diastolic heart failure  He appears well compensated today on exam. ?He describes NYHA class II heart failure symptoms.  ?  Hypertension  His blood pressure is optimally controlled on his current regimen.  ?  Class III severe obesity  ?  I appreciate the opportunity to participate in his care.  ?  Dorena Cookey, APRN  Pager [phone]           Current Medications (including today's revisions)  ? amLODIPine (NORVASC) 5 mg tablet Take one tablet by mouth daily.   ? aspirin EC 81 mg tablet Take one tablet by mouth daily. Take with food.   ? CHOLEcalciferoL (vitamin D3) (VITAMIN D3) 50 mcg (2,000 unit) tablet Take one tablet by mouth daily.   ? clopiDOGreL (PLAVIX) 75 mg tablet Take one tablet by mouth daily.   ? hydroCHLOROthiazide (HYDRODIURIL) 25 mg tablet Take one-half tablet by mouth every morning. Pt told to cut in half   ? levothyroxine (SYNTHROID) 50 mcg tablet Take one tablet by mouth daily 30 minutes before breakfast.   ? metoprolol succinate XL (TOPROL XL) 50 mg extended release tablet Take one tablet by mouth daily.   ? multivitamin/iron/folic acid (CENTRUM COMPLETE PO) Take  by mouth daily.   ? rosuvastatin (CRESTOR) 20 mg tablet Take one tablet by mouth at bedtime daily.

## 2021-04-11 NOTE — Progress Notes
5 Meter walks complete:     1st Trial = 6.15 seconds  2nd Trial = 6.03 seconds  3rd Trial = 6.16 seconds

## 2021-04-11 NOTE — Progress Notes
Pre-op education appt complete with [facility] this am. Informed of current visitor policy.  Reviewed and provided written pre-op instructions.  Instructed pt to be NPO @ 2300 the night before surgery, except for a sip of water to take any meds that the [facility] pharmacist instructs pt to take on the morning of surgery.  Instructed to cont taking ASA and Plavix as prescribed, including on the morning of surgery.  As per written instructions, pt to take 2 preop showers w/ provided CHG 4% soap prior to coming to the hospital for surgery. Instructed to check in at [facility] Admissions at 0815 on DOS.  Discussed process for pre-op, intra-op and post-op recovery.  Discussed post-op pain; lifting and driving restrictions; importance of mobilization and s/s of infection after discharge.  Instructed to call the CTS office if having any health changes prior to surgery.  Pt verbalized understanding of all instructions.  Denies skin or dental issues - has upper partial; denies s/s UTI. KCCQ complete - see flowsheet.  75M walks complete - see additional note.  Pt denies s/s covid; but he has a horrible cough and has some congestion; states that he is "getting over a head cold"; covid swab done at this appt.  Preop labs drawn in CTS Lab.  Escorted pt to radiology for CXR.  All questions answered to the pt's satisfaction.  Escorted to [facility] for Pre-registration and then to [facility].   kz

## 2021-04-11 NOTE — Patient Instructions
Please review your pre procedure instructions regarding TAVR procedure.

## 2021-04-12 ENCOUNTER — Encounter: Admit: 2021-04-12 | Discharge: 2021-04-12 | Payer: MEDICARE

## 2021-04-12 ENCOUNTER — Inpatient Hospital Stay: Admit: 2021-04-12 | Discharge: 2021-04-12 | Payer: MEDICARE

## 2021-04-12 ENCOUNTER — Ambulatory Visit: Admit: 2021-04-12 | Discharge: 2021-04-12 | Payer: MEDICARE

## 2021-04-12 DIAGNOSIS — I350 Nonrheumatic aortic (valve) stenosis: Secondary | ICD-10-CM

## 2021-04-12 DIAGNOSIS — R0989 Other specified symptoms and signs involving the circulatory and respiratory systems: Secondary | ICD-10-CM

## 2021-04-12 DIAGNOSIS — I2510 Atherosclerotic heart disease of native coronary artery without angina pectoris: Secondary | ICD-10-CM

## 2021-04-12 DIAGNOSIS — E039 Hypothyroidism, unspecified: Secondary | ICD-10-CM

## 2021-04-12 DIAGNOSIS — Z20822 Encounter for screening laboratory testing for COVID-19 virus: Secondary | ICD-10-CM

## 2021-04-12 DIAGNOSIS — R052 Subacute cough: Secondary | ICD-10-CM

## 2021-04-12 DIAGNOSIS — E669 Obesity, unspecified: Secondary | ICD-10-CM

## 2021-04-12 DIAGNOSIS — Z8719 Personal history of other diseases of the digestive system: Secondary | ICD-10-CM

## 2021-04-12 DIAGNOSIS — I10 Essential (primary) hypertension: Secondary | ICD-10-CM

## 2021-04-12 LAB — COVID-19 (SARS-COV-2) PCR

## 2021-04-12 MED ORDER — LIDOCAINE (PF) 10 MG/ML (1 %) IJ SOLN
SUBCUTANEOUS | 0 refills | Status: CP
Start: 2021-04-12 — End: ?
  Administered 2021-04-12: 16:00:00 1 mL via SUBCUTANEOUS

## 2021-04-12 MED ORDER — CEFAZOLIN 1 GRAM IJ SOLR
INTRAVENOUS | 0 refills | Status: DC
Start: 2021-04-12 — End: 2021-04-12
  Administered 2021-04-12: 17:00:00 3 g via INTRAVENOUS

## 2021-04-12 MED ORDER — ONDANSETRON HCL (PF) 4 MG/2 ML IJ SOLN
INTRAVENOUS | 0 refills | Status: DC
Start: 2021-04-12 — End: 2021-04-12
  Administered 2021-04-12: 18:00:00 4 mg via INTRAVENOUS

## 2021-04-12 MED ORDER — DEXAMETHASONE SODIUM PHOSPHATE 4 MG/ML IJ SOLN
INTRAVENOUS | 0 refills | Status: DC
Start: 2021-04-12 — End: 2021-04-12
  Administered 2021-04-12: 17:00:00 4 mg via INTRAVENOUS

## 2021-04-12 MED ORDER — ROCURONIUM 10 MG/ML IV SOLN
INTRAVENOUS | 0 refills | Status: DC
Start: 2021-04-12 — End: 2021-04-12
  Administered 2021-04-12: 17:00:00 50 mg via INTRAVENOUS

## 2021-04-12 MED ORDER — PROPOFOL INJ 10 MG/ML IV VIAL
INTRAVENOUS | 0 refills | Status: DC
Start: 2021-04-12 — End: 2021-04-12
  Administered 2021-04-12: 17:00:00 100 mg via INTRAVENOUS
  Administered 2021-04-12: 17:00:00 50 mg via INTRAVENOUS

## 2021-04-12 MED ORDER — LIDOCAINE (PF) 200 MG/10 ML (2 %) IJ SYRG
INTRAVENOUS | 0 refills | Status: DC
Start: 2021-04-12 — End: 2021-04-12
  Administered 2021-04-12: 17:00:00 100 mg via INTRAVENOUS

## 2021-04-12 MED ORDER — FENTANYL CITRATE (PF) 50 MCG/ML IJ SOLN
INTRAVENOUS | 0 refills | Status: DC
Start: 2021-04-12 — End: 2021-04-12
  Administered 2021-04-12 (×2): 50 ug via INTRAVENOUS

## 2021-04-12 MED ORDER — HYDRALAZINE 20 MG/ML IJ SOLN
INTRAVENOUS | 0 refills | Status: DC
Start: 2021-04-12 — End: 2021-04-12
  Administered 2021-04-12: 19:00:00 5 mg via INTRAVENOUS

## 2021-04-12 MED ORDER — HALOPERIDOL LACTATE 5 MG/ML IJ SOLN
INTRAVENOUS | 0 refills | Status: DC
Start: 2021-04-12 — End: 2021-04-12
  Administered 2021-04-12: 19:00:00 5 mg via INTRAVENOUS

## 2021-04-12 MED ORDER — PHENYLEPHRINE 40 MCG/ML IN NS IV DRIP (STD CONC)
INTRAVENOUS | 0 refills | Status: DC
Start: 2021-04-12 — End: 2021-04-12
  Administered 2021-04-12 (×2): .5 ug/kg/min via INTRAVENOUS

## 2021-04-12 MED ORDER — NOREPINEPHRINE IV DRIP STD CONC (AM)(OR)
INTRAVENOUS | 0 refills | Status: DC
Start: 2021-04-12 — End: 2021-04-12
  Administered 2021-04-12: 18:00:00 .02 ug/kg/min via INTRAVENOUS

## 2021-04-12 MED ORDER — SUGAMMADEX 100 MG/ML IV SOLN
INTRAVENOUS | 0 refills | Status: DC
Start: 2021-04-12 — End: 2021-04-12
  Administered 2021-04-12: 19:00:00 260 mg via INTRAVENOUS

## 2021-04-12 MED ORDER — DEXMEDETOMIDINE IN 0.9 % NACL 20 MCG/5 ML (4 MCG/ML) IV SYRG
INTRAVENOUS | 0 refills | Status: DC
Start: 2021-04-12 — End: 2021-04-12
  Administered 2021-04-12 (×2): 20 ug via INTRAVENOUS

## 2021-04-12 MED ORDER — HEPARIN (PORCINE) 1,000 UNIT/ML IJ SOLN
INTRAVENOUS | 0 refills | Status: DC
Start: 2021-04-12 — End: 2021-04-12
  Administered 2021-04-12: 18:00:00 8000 [IU] via INTRAVENOUS

## 2021-04-12 MED ORDER — PROTAMINE 10 MG/ML IV SOLN
INTRAVENOUS | 0 refills | Status: DC
Start: 2021-04-12 — End: 2021-04-12
  Administered 2021-04-12: 18:00:00 30 mg via INTRAVENOUS
  Administered 2021-04-12: 18:00:00 20 mg via INTRAVENOUS

## 2021-04-12 MED ADMIN — POTASSIUM CHLORIDE IN WATER 10 MEQ/50 ML IV PGBK [11075]: 10 meq | INTRAVENOUS | @ 21:00:00 | Stop: 2022-04-12 | NDC 00338070541

## 2021-04-12 MED ADMIN — SODIUM CHLORIDE 0.9 % IV SOLP [27838]: 1000.000 mL | INTRAVENOUS | @ 14:00:00 | Stop: 2021-04-14 | NDC 00338004904

## 2021-04-12 MED ADMIN — MAGNESIUM SULFATE IN D5W 1 GRAM/100 ML IV PGBK [166578]: 1 g | INTRAVENOUS | @ 21:00:00 | Stop: 2021-04-27 | NDC 00338170940

## 2021-04-12 MED ADMIN — SODIUM CHLORIDE 0.9 % IV SOLP [27838]: 1000.000 mL | INTRAVENOUS | @ 19:00:00 | Stop: 2021-04-13 | NDC 00338004904

## 2021-04-13 ENCOUNTER — Inpatient Hospital Stay: Admit: 2021-04-13 | Discharge: 2021-04-13 | Payer: MEDICARE

## 2021-04-13 ENCOUNTER — Encounter: Admit: 2021-04-13 | Discharge: 2021-04-13 | Payer: MEDICARE

## 2021-04-13 MED ADMIN — HYDRALAZINE 20 MG/ML IJ SOLN [3697]: 10 mg | INTRAVENOUS | @ 05:00:00 | Stop: 2021-04-13 | NDC 63323061400

## 2021-04-13 MED ADMIN — SENNOSIDES-DOCUSATE SODIUM 8.6-50 MG PO TAB [40926]: 2 | ORAL | @ 02:00:00 | NDC 00536124801

## 2021-04-13 MED ADMIN — OXYCODONE 5 MG PO TAB [10814]: 5 mg | ORAL | @ 02:00:00 | NDC 00406055223

## 2021-04-13 MED ADMIN — LEVOTHYROXINE 50 MCG PO TAB [4421]: 50 ug | ORAL | @ 11:00:00 | Stop: 2021-04-13 | NDC 51079044001

## 2021-04-13 MED ADMIN — CEFAZOLIN INJ 1GM IVP [210319]: 2 g | INTRAVENOUS | @ 02:00:00 | Stop: 2021-04-14 | NDC 60505614200

## 2021-04-13 MED ADMIN — ASPIRIN 81 MG PO CHEW [680]: 81 mg | ORAL | @ 13:00:00 | Stop: 2021-04-13 | NDC 66553000201

## 2021-04-13 MED ADMIN — METOPROLOL SUCCINATE 25 MG PO TB24 [81866]: 12.5 mg | ORAL | @ 13:00:00 | Stop: 2021-04-13 | NDC 00904632261

## 2021-04-13 MED ADMIN — AMLODIPINE 5 MG PO TAB [79041]: 5 mg | ORAL | @ 13:00:00 | Stop: 2021-04-13 | NDC 00904637061

## 2021-04-13 MED ADMIN — OXYCODONE 5 MG PO TAB [10814]: 5 mg | ORAL | @ 10:00:00 | Stop: 2021-04-13 | NDC 00406055223

## 2021-04-13 MED ADMIN — HYDROCHLOROTHIAZIDE 12.5 MG PO TAB [136518]: 12.5 mg | ORAL | @ 13:00:00 | Stop: 2021-04-13 | NDC 69315015501

## 2021-04-13 MED ADMIN — OXYCODONE 5 MG PO TAB [10814]: 5 mg | ORAL | @ 06:00:00 | Stop: 2021-04-13 | NDC 00406055223

## 2021-04-13 MED ADMIN — CLOPIDOGREL 75 MG PO TAB [78966]: 75 mg | ORAL | @ 13:00:00 | Stop: 2021-04-13 | NDC 00904629461

## 2021-04-13 MED ADMIN — ROSUVASTATIN 20 MG PO TAB [88504]: 20 mg | ORAL | @ 02:00:00 | NDC 68462026390

## 2021-04-13 MED ADMIN — ACETAMINOPHEN 325 MG PO TAB [101]: 650 mg | ORAL | @ 02:00:00 | NDC 00904677361

## 2021-04-13 MED ADMIN — ACETAMINOPHEN 325 MG PO TAB [101]: 650 mg | ORAL | @ 09:00:00 | Stop: 2021-04-13 | NDC 00904677361

## 2021-04-13 MED ADMIN — CEFAZOLIN INJ 1GM IVP [210319]: 2 g | INTRAVENOUS | @ 09:00:00 | Stop: 2021-04-13 | NDC 60505614200

## 2021-04-13 MED ADMIN — OXYCODONE 5 MG PO TAB [10814]: 5 mg | ORAL | @ 13:00:00 | Stop: 2021-04-13 | NDC 00406055223

## 2021-04-16 ENCOUNTER — Encounter: Admit: 2021-04-16 | Discharge: 2021-04-16 | Payer: MEDICARE

## 2021-04-16 DIAGNOSIS — I10 Essential (primary) hypertension: Secondary | ICD-10-CM

## 2021-04-16 DIAGNOSIS — Z952 Presence of prosthetic heart valve: Secondary | ICD-10-CM

## 2021-04-23 ENCOUNTER — Encounter: Admit: 2021-04-23 | Discharge: 2021-04-23 | Payer: MEDICARE

## 2021-04-23 ENCOUNTER — Ambulatory Visit: Admit: 2021-04-23 | Discharge: 2021-04-23 | Payer: MEDICARE

## 2021-04-23 DIAGNOSIS — E039 Hypothyroidism, unspecified: Secondary | ICD-10-CM

## 2021-04-23 DIAGNOSIS — Z8719 Personal history of other diseases of the digestive system: Secondary | ICD-10-CM

## 2021-04-23 DIAGNOSIS — E782 Mixed hyperlipidemia: Secondary | ICD-10-CM

## 2021-04-23 DIAGNOSIS — I2510 Atherosclerotic heart disease of native coronary artery without angina pectoris: Secondary | ICD-10-CM

## 2021-04-23 DIAGNOSIS — Z952 Presence of prosthetic heart valve: Secondary | ICD-10-CM

## 2021-04-23 DIAGNOSIS — I10 Essential (primary) hypertension: Secondary | ICD-10-CM

## 2021-04-23 DIAGNOSIS — I350 Nonrheumatic aortic (valve) stenosis: Secondary | ICD-10-CM

## 2021-04-23 DIAGNOSIS — E669 Obesity, unspecified: Secondary | ICD-10-CM

## 2021-04-23 DIAGNOSIS — Z136 Encounter for screening for cardiovascular disorders: Secondary | ICD-10-CM

## 2021-04-23 DIAGNOSIS — I5032 Chronic diastolic (congestive) heart failure: Secondary | ICD-10-CM

## 2021-04-23 MED ORDER — METOPROLOL SUCCINATE 25 MG PO TB24
25 mg | ORAL_TABLET | Freq: Every day | ORAL | 0 refills | 90.00000 days | Status: AC
Start: 2021-04-23 — End: ?

## 2021-04-23 NOTE — Progress Notes
Date of Service: 04/23/2021    Robert Butler is a 66 y.o. male.       HPI     I had the pleasure of seeing Robert Butler for hospital follow-up after recent TAVR. 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 He also has known hypertension, coronary artery disease, and obesity. ?As part of his work-up for aortic stenosis he underwent echo Doppler on 12/01/2020 which revealed an EF of 70%, severe aortic stenosis with a mean gradient 61 mmHg, AVA 0.53, peak velocity 5.23 m/s with mild to moderate mitral regurgitation. ?He was referred for TEE which was completed on 11/30 and revealed severe aortic stenosis with a valve area 0.75 cm?, moderate mitral regurgitation with preserved LV function of 70%. ?Heart catheterization was also completed on 11/30 which revealed multivessel coronary artery disease including 70% mid RCA disease, 70 to 80% circumflex disease and 90% LAD disease. ?He was evaluated by outside CTS surgeon who recommended surgical aortic valve replacement and CABG and he presented?to [facility] for second opinion.  ?  He was evaluated by Dr. Paris Lore and Dr. Helen Hashimoto and initially had opted for open AVR and CABG however the patient changed his mind and would prefer TAVR with PCI. ?He underwent staged PCI on 2/22 to the mid LAD and the mid RCA.    He was admitted on 3/23 and underwent successful TAVR with a 29 mm SAPIEN S3 ultra Resilia bioprosthetic valve with Dr. Helen Hashimoto and Dr. Mackey Birchwood.  His heart rhythm remained stable however of the PR interval did slightly increase.  A postoperative echocardiogram demonstrated a 29 mm Sapien S3 Ultra Resilia bioprosthetic valve with no stenosis and no regurgitation.   He was discharged home on postoperative day #1.    Today he reports that he notes mild symptomatic improvement since TAVR procedure.  He feels he is less short of breath with activity and reports that he is walking more since he has been home.  He has been walking up to 10 minutes at a time without any significant shortness of breath.  He denies orthopnea, PND, or lower extremity edema.  His weights at home have been stable.  He denies chest pain, palpitations, dizziness, lightheadedness, or near syncope.  He denies groin access site complications.           Vitals:    04/23/21 1031   BP: 136/66  Comment: pt has not taken  morning medications. could be cause of elevated BP between 130-140   BP Source: Arm, Left Upper   Pulse: 61   SpO2: 97%   O2 Device: None (Room air)   PainSc: Zero   Weight: 132.9 kg (293 lb)   Height: 177.8 cm (5' 10)     Body mass index is 42.04 kg/m?Marland Kitchen     Past Medical History  Patient Active Problem List    Diagnosis Date Noted   ? 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 Hypomagnesemia 04/12/2021   ? Hypokalemia 04/12/2021   ? 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 Chronic diastolic heart failure, NYHA class 2 (HCC) 04/02/2021   ? 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 Coronary artery disease due to lipid rich plaque 03/14/2021   ? Coronary artery disease 02/01/2021   ? Hypertension 02/01/2021   ? Class 3 severe obesity with body mass index (BMI) of 40.0 to 44.9 in adult [facility]) 02/01/2021   ?  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   HENT: Negative.    Eyes: Negative.    Cardiovascular: Negative.    Respiratory: Negative.    Endocrine: Negative.    Hematologic/Lymphatic: Negative.    Skin: Negative.    Musculoskeletal: Negative.    Gastrointestinal: Negative.    Genitourinary: Negative.    Neurological: Negative.    Psychiatric/Behavioral: Negative.    Allergic/Immunologic: Negative.        Physical Exam  General Appearance: no acute distress  Skin: warm & intact  HEENT: unremarkable  Neck Veins: neck veins are flat & not distended  Chest Inspection: chest is normal in appearance  Auscultation/Percussion: lungs clear to auscultation, no rales, rhonchi, or wheezing  Cardiac Rhythm: regular rhythm & normal rate  Cardiac Auscultation: Normal S1 & S2, no S3 or S4, no rub  Murmurs: no cardiac murmurs   Extremities: no lower extremity edema, bilateral groin soft without hematoma  Abdominal Exam: soft, non-tender, no masses, bowel sounds normal  Liver & Spleen: no organomegaly  Neurologic Exam: oriented to time, place and person; no focal neurologic deficits  Psychiatric: Normal mood and affect.  Behavior is normal. Judgment and thought content normal.         Cardiovascular Studies  Preliminary EKG: Sinus rhythm with PACs, ventricular rate 61, PR interval 204 ms    Cardiovascular Health Factors  Vitals BP Readings from Last 3 Encounters:   04/23/21 136/66   04/13/21 (!) 155/82   04/11/21 132/78     Wt Readings from Last 3 Encounters:   04/23/21 132.9 kg (293 lb)   04/13/21 132.5 kg (292 lb)   04/11/21 136.1 kg (300 lb)     BMI Readings from Last 3 Encounters:   04/23/21 42.04 kg/m?   04/13/21 41.90 kg/m?   04/11/21 43.05 kg/m?      Smoking Social History     Tobacco Use   Smoking Status Never   Smokeless Tobacco Never      Lipid Profile Cholesterol   Date Value Ref Range Status   03/14/2021 137 <200 MG/DL Final     HDL   Date Value Ref Range Status   03/14/2021 43 >40 MG/DL Final     LDL   Date Value Ref Range Status   03/14/2021 71 <100 mg/dL Final     Triglycerides   Date Value Ref Range Status   03/14/2021 77 <150 MG/DL Final      Blood Sugar Hemoglobin A1C   Date Value Ref Range Status   04/11/2021 5.8 (H) 4.0 - 5.7 % Final     Comment:     The ADA recommends that most patients with type 1 and type 2 diabetes maintain   an A1c level <7%.       Glucose   Date Value Ref Range Status   04/13/2021 111 (H) 70 - 100 MG/DL Final   16/10/9602 540 (H) 70 - 100 MG/DL Final   98/11/9145 87 70 - 100 MG/DL Final          Problems Addressed Today  Encounter Diagnoses   Name Primary?   ? Screening for heart disease Yes   ? Nonrheumatic aortic valve stenosis    ? S/P TAVR (transcatheter aortic valve replacement)    ? Mixed hyperlipidemia    ? Hypertension, unspecified type    ? Coronary artery disease due to lipid rich plaque    ? Chronic  diastolic heart failure, NYHA class 2 (HCC)    ? Class 3 severe obesity due to excess calories without serious comorbidity with body mass index (BMI) of 40.0 to 44.9 in adult [facility])        Assessment and Plan     Aortic stenosis  Status post TAVR with a 29 mm SAPIEN valve.  He notes symptomatic improvement.  I have asked him to gradually increase activity as tolerated.  He has been cleared to start cardiac rehab.  He will return on May 10 for echo Doppler and office visit as part of the TVT registry.    Coronary artery disease  He underwent staged PCI to the RCA and LAD prior to TAVR.  He is on aspirin and Plavix.    Chronic diastolic heart failure  He appears well compensated today on exam. ?He describes NYHA class II heart failure symptoms.  ?  Hypertension  His blood pressure is optimally controlled on his current regimen.  ?  Class III severe obesity  ?  I appreciate the opportunity to participate in his care.  ?  Dorena Cookey, APRN  Pager [phone]           Current Medications (including today's revisions)  ? acetaminophen (TYLENOL) 325 mg tablet Take two tablets by mouth every 6 hours as needed.   ? amLODIPine (NORVASC) 5 mg tablet Take one tablet by mouth daily.   ? aspirin EC 81 mg tablet Take one tablet by mouth daily. Take with food.   ? CHOLEcalciferoL (vitamin D3) (VITAMIN D3) 50 mcg (2,000 unit) tablet Take one tablet by mouth daily.   ? clopiDOGreL (PLAVIX) 75 mg tablet Take one tablet by mouth daily.   ? hydroCHLOROthiazide (HYDRODIURIL) 25 mg tablet Take one-half tablet by mouth every morning. Pt told to cut in half   ? levothyroxine (SYNTHROID) 50 mcg tablet Take one tablet by mouth daily 30 minutes before breakfast.   ? metoprolol succinate XL (TOPROL XL) 25 mg extended release tablet Take one tablet by mouth daily. Check blood pressure daily. If systolic greater than 130 and heart rate greater than 60 increase to 25 mg until follow up appointment   ? multivitamin/iron/folic acid (CENTRUM COMPLETE PO) Take  by mouth daily.   ? oxyCODONE (ROXICODONE) 5 mg tablet Take one tablet by mouth every 4 hours as needed.   ? rosuvastatin (CRESTOR) 20 mg tablet Take one tablet by mouth at bedtime daily.   ? senna/docusate (SENOKOT-S) 8.6/50 mg tablet Take two tablets by mouth twice daily as needed.

## 2021-04-23 NOTE — Patient Instructions
Continue to gradually increase activity as tolerated.  You are cleared to start cardiac rehab once they contact you.     You will return on 5/10 for echo doppler and office visit.

## 2021-05-30 ENCOUNTER — Ambulatory Visit: Admit: 2021-05-30 | Discharge: 2021-05-30 | Payer: MEDICARE

## 2021-05-30 ENCOUNTER — Encounter: Admit: 2021-05-30 | Discharge: 2021-05-30 | Payer: MEDICARE

## 2021-05-30 DIAGNOSIS — R0989 Other specified symptoms and signs involving the circulatory and respiratory systems: Secondary | ICD-10-CM

## 2021-05-30 DIAGNOSIS — I10 Essential (primary) hypertension: Secondary | ICD-10-CM

## 2021-05-30 DIAGNOSIS — Z952 Presence of prosthetic heart valve: Secondary | ICD-10-CM

## 2021-05-30 DIAGNOSIS — E039 Hypothyroidism, unspecified: Secondary | ICD-10-CM

## 2021-05-30 DIAGNOSIS — I350 Nonrheumatic aortic (valve) stenosis: Secondary | ICD-10-CM

## 2021-05-30 DIAGNOSIS — Z8719 Personal history of other diseases of the digestive system: Secondary | ICD-10-CM

## 2021-05-30 DIAGNOSIS — I2510 Atherosclerotic heart disease of native coronary artery without angina pectoris: Secondary | ICD-10-CM

## 2021-05-30 DIAGNOSIS — E669 Obesity, unspecified: Secondary | ICD-10-CM

## 2021-05-30 LAB — BASIC METABOLIC PANEL
POTASSIUM: 4.7 MMOL/L (ref 3.5–5.1)
SODIUM: 140 MMOL/L (ref 137–147)

## 2021-05-30 LAB — CBC
HEMATOCRIT: 41 % (ref 40–50)
MCH: 27 pg (ref 26–34)
MCHC: 32 g/dL (ref 32.0–36.0)
MCV: 84 FL (ref 80–100)
MPV: 8.7 FL (ref 7–11)
PLATELET COUNT: 166 K/UL (ref 150–400)
RBC COUNT: 4.9 M/UL (ref 4.4–5.5)
RDW: 15 % — ABNORMAL HIGH (ref >60)
WBC COUNT: 6.1 K/UL (ref 4.5–11.0)

## 2021-05-30 LAB — BNP (B-TYPE NATRIURETIC PEPTI): BNP: 89 pg/mL (ref 0–100)

## 2021-05-30 MED ORDER — PERFLUTREN LIPID MICROSPHERES 1.1 MG/ML IV SUSP
1-10 mL | Freq: Once | INTRAVENOUS | 0 refills | Status: CP | PRN
Start: 2021-05-30 — End: ?
  Administered 2021-05-30: 20:00:00 2 mL via INTRAVENOUS

## 2021-06-04 ENCOUNTER — Encounter: Admit: 2021-06-04 | Discharge: 2021-06-04 | Payer: MEDICARE

## 2021-06-04 DIAGNOSIS — Z8719 Personal history of other diseases of the digestive system: Secondary | ICD-10-CM

## 2021-06-04 DIAGNOSIS — I350 Nonrheumatic aortic (valve) stenosis: Secondary | ICD-10-CM

## 2021-06-04 DIAGNOSIS — E039 Hypothyroidism, unspecified: Secondary | ICD-10-CM

## 2021-06-04 DIAGNOSIS — E669 Obesity, unspecified: Secondary | ICD-10-CM

## 2021-06-04 DIAGNOSIS — I2510 Atherosclerotic heart disease of native coronary artery without angina pectoris: Secondary | ICD-10-CM

## 2021-06-04 DIAGNOSIS — I10 Essential (primary) hypertension: Secondary | ICD-10-CM

## 2021-09-26 ENCOUNTER — Encounter: Admit: 2021-09-26 | Discharge: 2021-09-26 | Payer: MEDICARE

## 2021-09-26 DIAGNOSIS — I10 Essential (primary) hypertension: Secondary | ICD-10-CM

## 2021-09-26 DIAGNOSIS — I350 Nonrheumatic aortic (valve) stenosis: Secondary | ICD-10-CM

## 2021-09-26 MED ORDER — METOPROLOL SUCCINATE 25 MG PO TB24
ORAL_TABLET | 0 refills
Start: 2021-09-26 — End: ?

## 2021-10-19 ENCOUNTER — Encounter: Admit: 2021-10-19 | Discharge: 2021-10-19 | Payer: MEDICARE

## 2022-01-24 ENCOUNTER — Encounter: Admit: 2022-01-24 | Discharge: 2022-01-24 | Payer: MEDICARE

## 2022-01-24 NOTE — Telephone Encounter
Called and left message for patient to call back Valve clinic @ [phone] to schedule a 1 year Post TAVR Follow-up

## 2022-02-20 ENCOUNTER — Encounter: Admit: 2022-02-20 | Discharge: 2022-02-20 | Payer: MEDICARE

## 2022-02-20 MED ORDER — CLOPIDOGREL 75 MG PO TAB
75 mg | ORAL_TABLET | Freq: Every day | ORAL | 2 refills
Start: 2022-02-20 — End: ?

## 2022-02-27 ENCOUNTER — Encounter: Admit: 2022-02-27 | Discharge: 2022-02-27 | Payer: MEDICARE

## 2022-02-27 MED ORDER — CLOPIDOGREL 75 MG PO TAB
75 mg | ORAL_TABLET | Freq: Every day | ORAL | 2 refills
Start: 2022-02-27 — End: ?

## 2022-04-11 ENCOUNTER — Encounter: Admit: 2022-04-11 | Discharge: 2022-04-11 | Payer: MEDICARE

## 2022-04-11 ENCOUNTER — Ambulatory Visit: Admit: 2022-04-11 | Discharge: 2022-04-11 | Payer: MEDICARE

## 2022-04-11 DIAGNOSIS — I5032 Chronic diastolic (congestive) heart failure: Secondary | ICD-10-CM

## 2022-04-11 DIAGNOSIS — Z952 Presence of prosthetic heart valve: Secondary | ICD-10-CM

## 2022-04-11 DIAGNOSIS — I10 Essential (primary) hypertension: Secondary | ICD-10-CM

## 2022-04-11 DIAGNOSIS — I350 Nonrheumatic aortic (valve) stenosis: Secondary | ICD-10-CM

## 2022-04-11 DIAGNOSIS — I2510 Atherosclerotic heart disease of native coronary artery without angina pectoris: Secondary | ICD-10-CM

## 2022-04-11 DIAGNOSIS — E669 Obesity, unspecified: Secondary | ICD-10-CM

## 2022-04-11 DIAGNOSIS — Z8719 Personal history of other diseases of the digestive system: Secondary | ICD-10-CM

## 2022-04-11 DIAGNOSIS — E039 Hypothyroidism, unspecified: Secondary | ICD-10-CM

## 2022-04-11 DIAGNOSIS — R0989 Other specified symptoms and signs involving the circulatory and respiratory systems: Secondary | ICD-10-CM

## 2022-04-11 LAB — BNP (B-TYPE NATRIURETIC PEPTI): BNP: 137 pg/mL — ABNORMAL HIGH (ref 0–100)

## 2022-04-11 LAB — BASIC METABOLIC PANEL
CO2: 28 MMOL/L (ref 21–30)
POTASSIUM: 4.1 MMOL/L (ref 3.5–5.1)
SODIUM: 141 MMOL/L (ref 137–147)

## 2022-04-11 LAB — CBC
HEMATOCRIT: 42 % (ref 40–50)
HEMOGLOBIN: 13 g/dL (ref 13.5–16.5)
MCH: 27 pg (ref >60)
MCHC: 32 g/dL (ref 32.0–36.0)
MCV: 83 FL (ref 80–100)
MPV: 8.8 FL (ref 7–11)
PLATELET COUNT: 170 K/UL (ref 150–400)
RBC COUNT: 5.1 M/UL (ref 4.4–5.5)
RDW: 15 % — ABNORMAL HIGH (ref 11–15)
WBC COUNT: 6.3 K/UL (ref 4.5–11.0)

## 2022-04-11 MED ORDER — SODIUM CHLORIDE 0.9 % IJ SOLN
10 mL | Freq: Once | INTRAVENOUS | 0 refills | Status: CP
Start: 2022-04-11 — End: ?
  Administered 2022-04-11: 21:00:00 10 mL via INTRAVENOUS

## 2022-04-11 MED ORDER — PERFLUTREN LIPID MICROSPHERES 1.1 MG/ML IV SUSP
1-10 mL | Freq: Once | INTRAVENOUS | 0 refills | Status: CP | PRN
Start: 2022-04-11 — End: ?
  Administered 2022-04-11: 21:00:00 2 mL via INTRAVENOUS

## 2022-04-11 NOTE — Progress Notes
Date of Service: 04/11/2022    Robert Butler is a 67 y.o. male.       HPI       I had the pleasure of seeing Robert Butler for 1 year post TAVR follow-up.  He is a 67 year old with a history of hypertension, obesity, hyperlipidemia, coronary disease and aortic stenosis.  He had PCI to the mid LAD and mid RCA followed by TAVR with a 29 SAPIEN valve in March 2023.  Follow-up echoes have revealed well-seated valve with no stenosis or regurgitation.    He has done well over the past year.  He has had no hospitalizations.  He had symptomatic improvement following the procedure.  He continues to participate in cardiac rehab twice a week.  He is working on weight loss and has been successful at losing 27 pounds using [employer].  He denies any chest pain, shortness of breath, lower extremity edema, palpitations, near-syncope or syncope.  His blood pressure at cardiac rehab runs around 120/60 and has been stable.           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   HENT: Negative.     Eyes: Negative.    Cardiovascular: Negative.    Respiratory: Negative.     Endocrine: Negative.    Hematologic/Lymphatic: Negative.    Skin: Negative.    Musculoskeletal: Negative.    Gastrointestinal: Negative.    Genitourinary: Negative.    Neurological: Negative.    Psychiatric/Behavioral: Negative.     Allergic/Immunologic: Negative.        Physical Exam  General Appearance: no acute distress, obese  Skin: warm & intact  HEENT: unremarkable  Neck Veins: neck veins are flat & not distended  Carotid Arteries: no bruits  Chest Inspection: chest is normal in appearance  Auscultation/Percussion: lungs clear to auscultation, no rales, rhonchi, or wheezing  Cardiac Rhythm: regular rhythm & normal rate  Cardiac Auscultation: Normal S1 & S2, no S3 or S4, no rub  Murmurs: no cardiac murmurs   Extremities: no lower extremity edema; 2+ symmetric distal pulses  Abdominal Exam: soft, non-tender, no masses, bowel sounds normal  Liver & Spleen: no organomegaly  Neurologic Exam: oriented to time, place and person; no focal neurologic deficits  Psychiatric: Normal mood and affect.  Behavior is normal. Judgment and thought content normal.  Cardiovascular Studies  Preliminary EKG:    Sinus bradycardia, rate 51 bpm.      Cardiovascular Health Factors  Vitals BP Readings from Last 3 Encounters:   04/11/22 (!) 146/85   04/11/22 (!) 146/85   05/30/21 124/80     Wt Readings from Last 3 Encounters:   04/11/22 130.2 kg (287 lb)   04/11/22 130.5 kg (287 lb 9.6 oz)   05/30/21 135.6 kg (299 lb)     BMI Readings from Last 3 Encounters:   04/11/22 41.18 kg/m?   04/11/22 41.27 kg/m?   05/30/21 42.90 kg/m?      Smoking Social History     Tobacco Use   Smoking Status Never   Smokeless Tobacco Never      Lipid Profile Cholesterol   Date Value Ref Range Status   03/14/2021 137 <200 MG/DL Final     HDL   Date Value Ref Range Status   03/14/2021 43 >40 MG/DL Final     LDL   Date Value Ref Range Status   03/14/2021 71 <100 mg/dL Final     Triglycerides   Date Value Ref Range Status   03/14/2021 77 <150 MG/DL Final      Blood Sugar Hemoglobin A1C   Date Value Ref Range Status   04/11/2021 5.8 (H) 4.0 - 5.7 % Final     Comment:     The ADA recommends that most patients with type 1 and type 2 diabetes maintain   an A1c level <7%.       Glucose   Date Value Ref Range Status   05/30/2021 81 70 - 100 MG/DL Final   45/40/9811 914 (H) 70 - 100 MG/DL Final   78/29/5621 308 (H) 70 - 100 MG/DL Final          Problems Addressed Today  Encounter Diagnoses   Name Primary?    Cardiovascular symptoms Yes    Nonrheumatic aortic valve stenosis     S/P TAVR (transcatheter aortic valve replacement)     Primary hypertension     Class 3 severe obesity due to excess calories without serious comorbidity with body mass index (BMI) of 40.0 to 44.9 in adult [facility])     Chronic diastolic heart failure, NYHA class 2 (HCC)     Coronary artery disease involving native coronary artery of native heart without angina pectoris        Assessment and Plan     1.  Aortic stenosis status post TAVR with a 29 SAPIEN valve in March 2023.  He had an echocardiogram today that is pending however in preliminary review the valve is well-seated with no stenosis.  There does appear to be paravalvular regurgitation.  I will await the final read before determining if any further testing is needed.  He has had symptomatic improvement following TAVR.  He needs to continue aspirin and SBE prophylaxis lifelong.    2.  Coronary artery disease.  He had PCI to the LAD and RCA.  He denies anginal symptoms.  He can stop Plavix at this time but needs to continue aspirin indefinitely.  He needs aggressive risk factor modification.    3.  Chronic diastolic heart failure.  He has NYHA class I symptoms.  He has no evidence of volume overload on exam.    4.  Hypertension.  His blood pressure slightly elevated today but he states he did not take his medication this morning.  He states it has been within normal limits cardiac rehab.  5.  Obesity.  He BMI is 41.  He is working on weight loss and has lost 27 pounds over the past few months.    He needs to follow-up with cardiology routinely.  He will either see Dr. Wyvonnia Lora who he has seen before or we can see him in our [place] clinic.  Thank you for allowing [place] to participate in the care of this pleasant individual.  If you have any other questions or concerns, please do not hesitate to contact us.    Sherrlyn Hock, APRN  Structural Heart Nurse Practitioner  Pager [phone]               Current Medications (including today's revisions)   acetaminophen (TYLENOL) 325 mg tablet Take two tablets by mouth every 6 hours as needed.    amLODIPine (NORVASC) 5 mg tablet Take one tablet by mouth daily.    aspirin EC 81 mg tablet Take one tablet by mouth daily. Take with food.    CHOLEcalciferoL (vitamin D3) (VITAMIN D3) 50 mcg (2,000 unit) tablet Take one tablet by mouth daily.    hydroCHLOROthiazide (HYDRODIURIL) 25 mg tablet Take one-half tablet by mouth every morning. Pt told to cut in half    levothyroxine (SYNTHROID) 50 mcg tablet Take one tablet by mouth daily 30 minutes before breakfast.    metoprolol succinate XL (TOPROL XL) 25 mg extended release tablet Take one tablet by mouth daily. Check blood pressure daily. If systolic greater than 130 and heart rate greater than 60 increase to 25 mg until follow up appointment    multivitamin/iron/folic acid (CENTRUM COMPLETE PO) Take  by mouth daily.    oxyCODONE (ROXICODONE) 5 mg tablet Take one tablet by mouth every 4 hours as needed.    rosuvastatin (CRESTOR) 20 mg tablet Take one tablet by mouth at bedtime daily.    senna/docusate (SENOKOT-S) 8.6/50 mg tablet Take two tablets by mouth twice daily as needed.

## 2022-04-12 ENCOUNTER — Encounter: Admit: 2022-04-12 | Discharge: 2022-04-12 | Payer: MEDICARE

## 2022-04-12 DIAGNOSIS — Z952 Presence of prosthetic heart valve: Secondary | ICD-10-CM

## 2022-04-12 DIAGNOSIS — T8203XD Leakage of heart valve prosthesis, subsequent encounter: Secondary | ICD-10-CM

## 2022-04-12 NOTE — Telephone Encounter
-----   Message from [facility], APRN-NP sent at 04/12/2022  9:33 AM CDT -----  We talked about the PVL a little in clinic but I didn't have the final report.  Will you let him know that it's only mild and we don't need to do any other testing.  He will need an echo once a year.  Thanks!

## 2022-04-12 NOTE — Telephone Encounter
04/12/2022 9:47 AM   I spoke with patient - reviewed echocardiogram results. Informed patient we will repeat echocardiogram in 1 year. Patient has no questions at this time and will plan to follow-up with our [facility] [place] clinic September 2024 timeframe.

## 2022-10-30 ENCOUNTER — Encounter: Admit: 2022-10-30 | Discharge: 2022-10-30 | Payer: MEDICARE

## 2022-11-05 ENCOUNTER — Encounter: Admit: 2022-11-05 | Discharge: 2022-11-05 | Payer: MEDICARE

## 2022-11-05 DIAGNOSIS — E782 Mixed hyperlipidemia: Secondary | ICD-10-CM

## 2022-11-05 DIAGNOSIS — I2510 Atherosclerotic heart disease of native coronary artery without angina pectoris: Secondary | ICD-10-CM

## 2022-11-05 DIAGNOSIS — E669 Obesity, unspecified: Secondary | ICD-10-CM

## 2022-11-05 DIAGNOSIS — I10 Essential (primary) hypertension: Secondary | ICD-10-CM

## 2022-11-05 DIAGNOSIS — Z8719 Personal history of other diseases of the digestive system: Secondary | ICD-10-CM

## 2022-11-05 DIAGNOSIS — Z952 Presence of prosthetic heart valve: Secondary | ICD-10-CM

## 2022-11-05 DIAGNOSIS — E039 Hypothyroidism, unspecified: Secondary | ICD-10-CM

## 2022-11-05 DIAGNOSIS — I350 Nonrheumatic aortic (valve) stenosis: Secondary | ICD-10-CM

## 2022-11-05 MED ORDER — AMOXICILLIN 500 MG PO CAP
ORAL_CAPSULE | ORAL | 1 refills | 7.00000 days | Status: AC
Start: 2022-11-05 — End: ?

## 2022-11-05 NOTE — Assessment & Plan Note
His last echocardiogram showed a mild paravalvular leak, done back in March, 2024.  I scheduled a follow-up echocardiogram about a year out from the 1 he had earlier this year.  I do not really think the leak is going to cause any problems.  I gave him a prescription for amoxicillin with instructions on use prior to dental procedures.  He does see his dentist regularly and maintains good oral hygiene.

## 2022-11-05 NOTE — Assessment & Plan Note
Lab Results   Component Value Date    CHOL 155 05/28/2022    TRIG 140 05/28/2022    HDL 38 (L) 05/28/2022    LDL 89 05/28/2022    VLDL 28 05/28/2022    NONHDLCHOL 94 03/14/2021    CHOLHDLC 4 05/28/2022      Rosuva 20 mg/day.  I'll add a lipid profile to his upcoming lab work with Dr. Andreas Newport.  Goal is < 70.

## 2022-11-05 NOTE — Patient Instructions
PIck up Rx for Amoxil and use for any dental procedure  We'll schedule a stress test in March/April, 2025  Lipid profile in December  Echocardiogram in March, 2025.

## 2022-11-05 NOTE — Assessment & Plan Note
He had silent coronary disease and will need surveillance stress testing.  I'll schedule a treadmill only in about 6 months.

## 2022-11-05 NOTE — Assessment & Plan Note
He takes amlodipine 5 mg, hydrochlorothiazide 25 mg, and metoprolol XL 25 mg daily.  Goal for blood pressure is 130/80 and he appears to be treated to goal.

## 2022-11-05 NOTE — Progress Notes
Date of Service: 11/05/2022    Robert Butler is a 67 y.o. male.       HPI     Robert Butler was in the [facility] to establish cardiology follow-up here.  He underwent TAVR done at [place] back in March, 2023.  He required coronary stenting (RCA and LAD) 1 month previously for clinically silent coronary disease.    His last echocardiogram was a few months ago and he has a mild paravalvular leak that should not amount anything into the future.    He sees his dentist regularly and uses antibiotics appropriately for endocarditis prophylaxis.    He is participating in cardiac rehab here at [facility] and those sessions seem to be going well.    He denies any problems with exertional chest discomfort or breathlessness.  He has had no palpitations, syncope, or near syncope.  He denies any TIA or stroke symptoms.    He continues to work on weight loss and has lost approximately 35 pounds over the past 18 months.            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   HENT: Negative.     Eyes: Negative.    Cardiovascular: Negative.    Respiratory: Negative. Endocrine: Negative.    Hematologic/Lymphatic: Negative.    Skin: Negative.    Musculoskeletal: Negative.    Gastrointestinal: Negative.    Genitourinary: Negative.    Neurological: Negative.    Psychiatric/Behavioral: Negative.     Allergic/Immunologic: Negative.        Physical Exam    Physical Exam   General Appearance: no distress   Skin: warm, no ulcers or xanthomas   Digits and Nails: no cyanosis or clubbing   Eyes: conjunctivae and lids normal, pupils are equal and round   Teeth/Gums/Palate: dentition unremarkable, no lesions   Lips & Oral Mucosa: no pallor or cyanosis   Neck Veins: normal JVP , neck veins are not distended   Thyroid: no nodules, masses, tenderness or enlargement   Chest Inspection: chest is normal in appearance   Respiratory Effort: breathing comfortably, no respiratory distress   Auscultation/Percussion: lungs clear to auscultation, no rales or rhonchi, no wheezing   PMI: PMI not enlarged or displaced   Cardiac Rhythm: regular rhythm and normal rate   Cardiac Auscultation: S1, S2 normal, no rub, no gallop   Murmurs: grade  2 systolic murmur   Peripheral Circulation: normal peripheral circulation   Carotid Arteries: normal carotid upstroke bilaterally, no bruits   Radial Arteries: normal symmetric radial pulses   Abdominal Aorta: no abdominal aortic bruit   Pedal Pulses: normal symmetric pedal pulses   Lower Extremity Edema: no lower extremity edema   Abdominal Exam: soft, non-tender, no masses, bowel sounds normal   Liver & Spleen: no organomegaly   Gait & Station: walks without assistance   Muscle Strength: normal muscle tone   Orientation: oriented to time, place and person   Affect & Mood: appropriate and sustained affect   Language and Memory: patient responsive and seems to comprehend information   Neurologic Exam: neurological assessment grossly intact   Other: moves all extremities      Cardiovascular Health Factors  Vitals BP Readings from Last 3 Encounters:   11/05/22 125/74 04/11/22 (!) 146/85   04/11/22 (!) 146/85     Wt Readings from Last 3 Encounters:   11/05/22 126.9 kg (279 lb 12.8 oz)   04/11/22 130.5 kg (287 lb 9.6 oz)   04/11/22 130.2 kg (287 lb)     BMI Readings from Last 3 Encounters:   11/05/22 40.15 kg/m?   04/11/22 41.27 kg/m?   04/11/22 41.18 kg/m?      Smoking Social History     Tobacco Use   Smoking Status Never   Smokeless Tobacco Never      Lipid Profile Cholesterol   Date Value Ref Range Status   05/28/2022 155  Final     HDL   Date Value Ref Range Status   05/28/2022 38 (L) >=40 Final     LDL   Date Value Ref Range Status   05/28/2022 89  Final     Triglycerides   Date Value Ref Range Status   05/28/2022 140  Final      Blood Sugar Hemoglobin A1C   Date Value Ref Range Status   04/11/2021 5.8 (H) 4.0 - 5.7 % Final     Comment:     The ADA recommends that most patients with type 1 and type 2 diabetes maintain   an A1c level <7%.       Glucose   Date Value Ref Range Status   05/28/2022 99  Final   04/11/2022 87 70 - 100 MG/DL Final   16/10/9602 81 70 - 100 MG/DL Final          Problems Addressed Today  Encounter Diagnoses   Name Primary?    Coronary artery disease involving native coronary artery of native heart without angina pectoris Yes    Mixed hyperlipidemia     Primary hypertension     S/P TAVR (transcatheter aortic valve replacement)        Assessment and Plan       Mixed hyperlipidemia  Lab Results   Component Value Date    CHOL 155 05/28/2022    TRIG 140 05/28/2022    HDL 38 (L) 05/28/2022    LDL 89 05/28/2022    VLDL 28 05/28/2022    NONHDLCHOL 94 03/14/2021    CHOLHDLC 4 05/28/2022      Rosuva 20 mg/day.  I'll add a lipid profile to his upcoming lab work with Dr. Andreas Newport.  Goal is < 70.    Coronary artery disease  He had silent coronary disease and will need surveillance stress testing.  I'll schedule a treadmill only in about 6 months.    Hypertension  He takes amlodipine  5 mg, hydrochlorothiazide 25 mg, and metoprolol XL 25 mg daily.  Goal for blood pressure is 130/80 and he appears to be treated to goal.    S/P TAVR (transcatheter aortic valve replacement)  His last echocardiogram showed a mild paravalvular leak, done back in March, 2024.  I scheduled a follow-up echocardiogram about a year out from the 1 he had earlier this year.  I do not really think the leak is going to cause any problems.  I gave him a prescription for amoxicillin with instructions on use prior to dental procedures.  He does see his dentist regularly and maintains good oral hygiene.      Current Medications (including today's revisions)   acetaminophen (TYLENOL) 325 mg tablet Take two tablets by mouth every 6 hours as needed.    amLODIPine (NORVASC) 5 mg tablet Take one tablet by mouth daily.    amoxicillin (AMOXIL) 500 mg capsule 4 capsules about 30 minutes before dental procedures    aspirin EC 81 mg tablet Take one tablet by mouth daily. Take with food.    CHOLEcalciferoL (vitamin D3) (VITAMIN D3) 50 mcg (2,000 unit) tablet Take one tablet by mouth daily.    hydroCHLOROthiazide (HYDRODIURIL) 25 mg tablet Take one-half tablet by mouth every morning. Pt told to cut in half    levothyroxine (SYNTHROID) 50 mcg tablet Take one tablet by mouth daily 30 minutes before breakfast.    metoprolol succinate XL (TOPROL XL) 25 mg extended release tablet Take one tablet by mouth daily. Check blood pressure daily. If systolic greater than 130 and heart rate greater than 60 increase to 25 mg until follow up appointment    multivitamin/iron/folic acid (CENTRUM COMPLETE PO) Take  by mouth daily.    rosuvastatin (CRESTOR) 20 mg tablet Take one tablet by mouth at bedtime daily.     Total time spent on today's office visit was 45 minutes.  This includes face-to-face in person visit with patient as well as nonface-to-face time including review of the EMR, outside records, labs, radiologic studies, echocardiogram & other cardiovascular studies, formation of treatment plan, after visit summary, future disposition, and lastly on documentation.

## 2022-11-28 LAB — LIPID PROFILE
CHOLESTEROL/HDL %: 4
CHOLESTEROL: 144
HDL: 40
LDL: 85
TRIGLYCERIDES: 96
VLDL: 19

## 2023-05-07 ENCOUNTER — Encounter: Admit: 2023-05-07 | Discharge: 2023-05-07 | Payer: MEDICARE

## 2023-05-07 NOTE — Telephone Encounter
 Patient did not answer phone call. Left a voicemail instead. Not set up in Mychart for messaging. Hold Metoprolol 48 hours prior to test.

## 2023-05-09 ENCOUNTER — Encounter: Admit: 2023-05-09 | Discharge: 2023-05-09 | Payer: MEDICARE

## 2023-05-09 ENCOUNTER — Ambulatory Visit: Admit: 2023-05-09 | Discharge: 2023-05-09 | Payer: MEDICARE

## 2023-05-10 IMAGING — US ECHOCOMPL
1 series · 16 of 24 positions shown · non-contrast
Comparison: none

PROCEDURES:
Echocardiographic Report:
Transthoracic echocardiogram with complete 2D, M-Mode, pulsed continuous wave and color Doppler were
utilized to image cardiac structures in accordance with the standards of the American College of
Cardiology and the American Society of Echocardiography.
-
INDICATIONS:
Indication(s): murmur.
Measurements:

2D/M Mode Doppler
Measurement Value Normal Range Measurement Value Normal Range
IVSd 2D 1.24 [ 0.60 - 1.00 ] cm AV Peak Vel 523.00 [ 100.00 - 170.00 ] cm/sec
LVIDd 2D 6.06 [ 4.20 - 5.84 ] cm AV VTI 136.00 cm
LVIDs 2D 3.54 [ 2.50 - 3.98 ] cm AV Mean Vel 364.00 [ 70.00 - 90.00 ] cm/sec
LVPWd 2D 1.06 [ 0.60 - 1.00 ] cm AV Mean PG 61.00 [ 2.00 - 4.00 ] mmHg
LA Diam 2D 4.60 cm AV Peak PG 109.00 [ 2.00 - 9.00 ] mmHg
AoR Diam 2D 4.00 [ 2.60 - 3.40 ] cm AVA VTI 0.53 [ 2.00 - 4.00 ] cm2
EF Mod 2C 69 [ 48 - 76 ] % AI PHT 886.00 msec
EF Mod 4C 71 [ 46 - 74 ] % LVOT Diam 2.00 [ 2.30 - 2.90 ] cm
EF Mod BP 70 [ 52 - 72 ] % LVOT Mean Vel 54.70 [ 60.00 - 80.00 ] cm/sec
LA Volume 2C 75.60 [ 18.00 - 58.00 ] ML LVOT Mean PG 1.00 [ 1.00 - 3.00 ] mmHg
LA Volume 4C 67.30 [ 18.00 - 58.00 ] Ml LVOT Peak Vel 83.60 [ 70.00 - 110.00 ] cm/sec
LA Volume Index 30 [ 16 - 34 ] mL/m2 LVOT Peak PG 3.00 [ 2.00 - 6.00 ] mmHg
TAPSE 2.24 [ 1.70 - 3.00 ] cm LVOT VTI 22.90 [ 20.00 - 30.00 ] cm
MV E Peak Vel 88.60 [ 60.00 - 130.00 ] cm/sec
MV A Peak Vel 79.90 [ 100.00 - 120.00 ] cm/sec
MV Decel Time 275.00 [ 104.00 - 258.00 ] msec
TR Peak Vel 148.00 [ 100.00 - 280.00 ] cm/sec
TR Peak PG 9.00 mmHg
RVSP 12.00 [ 10.00 - 36.00 ] mmHg
Lat E` Vel 8.32 [ 10.00 - 15.00 ] cm/sec
Lateral E/E` 10.60 [ 1.00 - 2.00 ] ratio

[Series 1: us echo 2d, complete · 119 acquisitions, 16 frames shown]
[im 1/119]
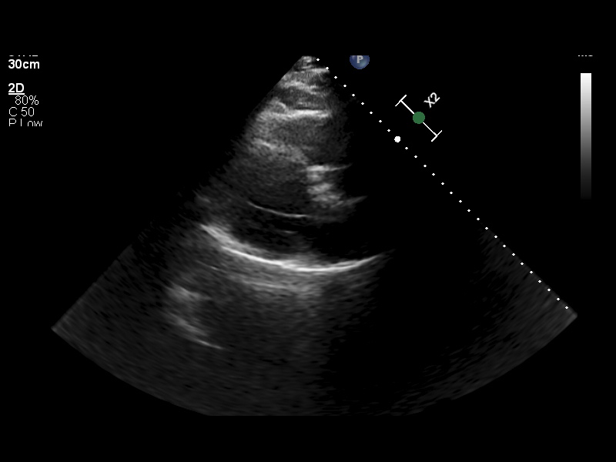
[im 6/119]
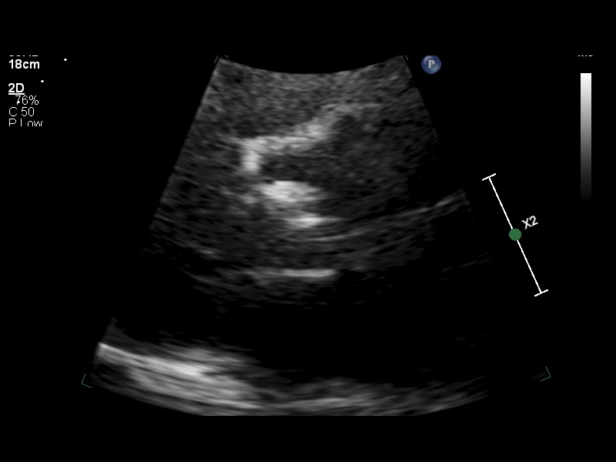
[im 16/119]
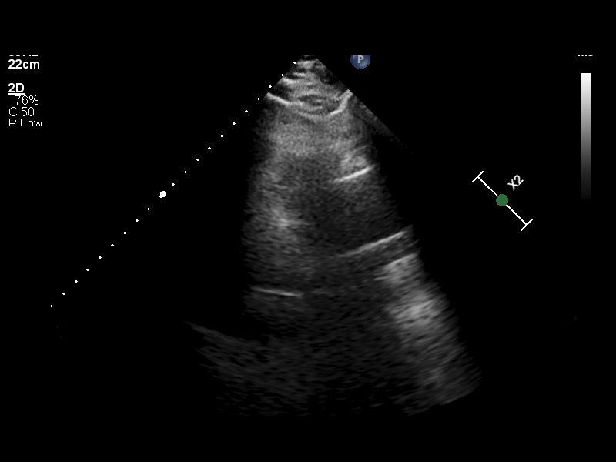
[im 21/119]
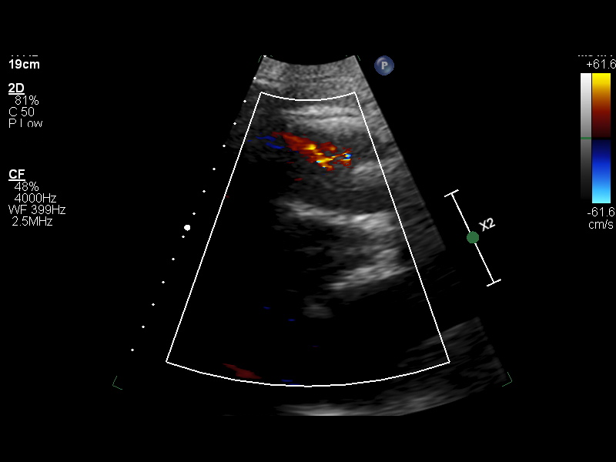
[im 26/119]
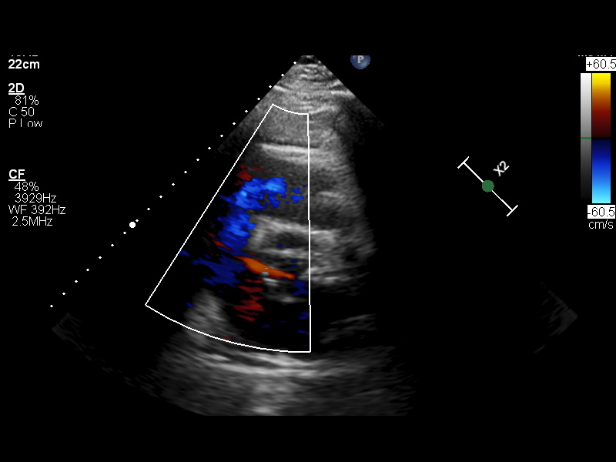
[im 36/119]
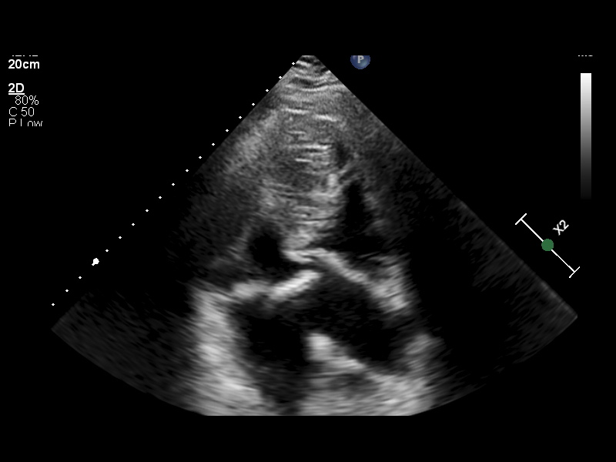
[im 42/119]
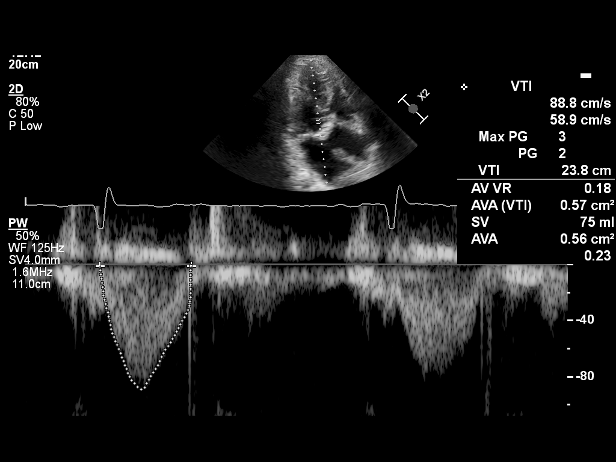
[im 57/119]
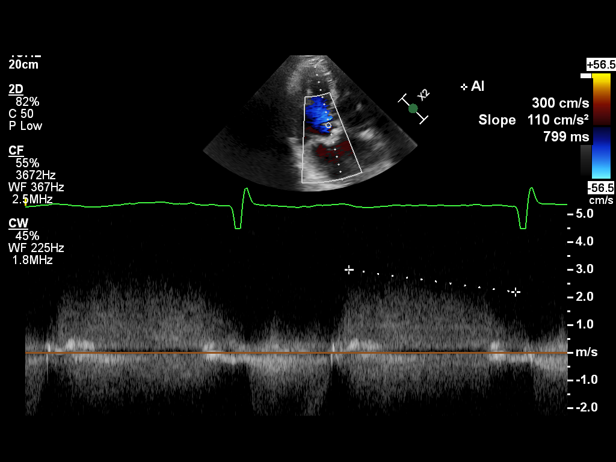
[im 67/119]
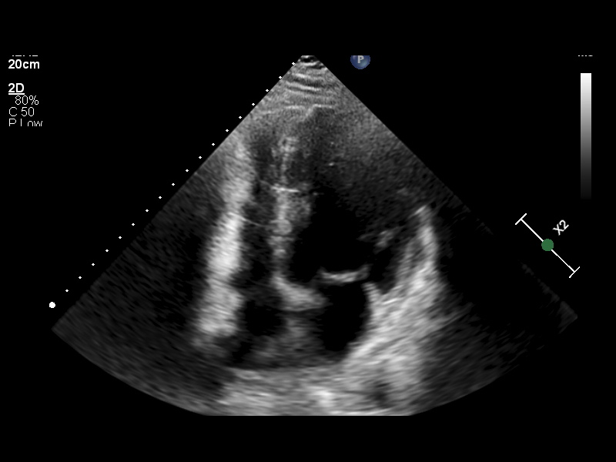
[im 77/119]
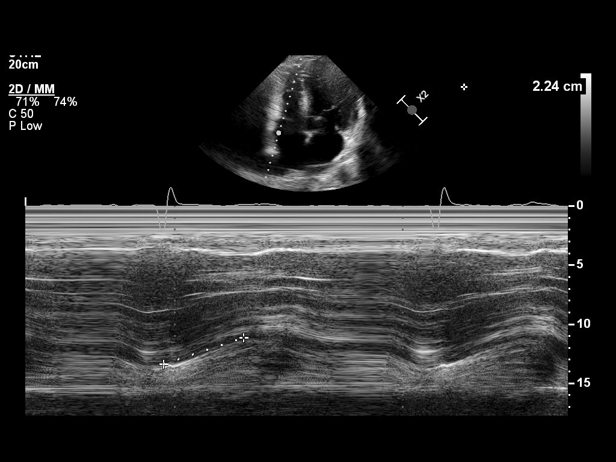
[im 83/119]
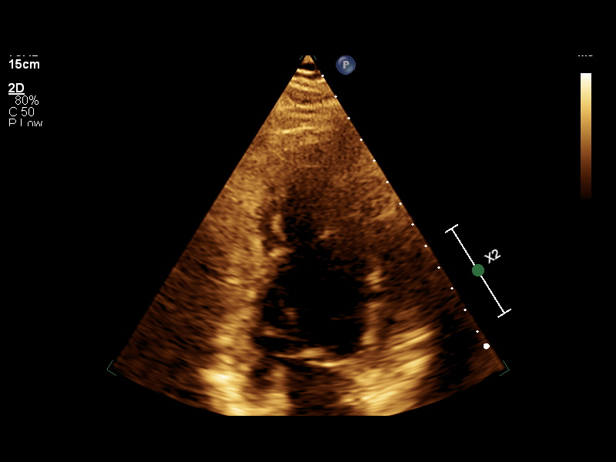
[im 93/119]
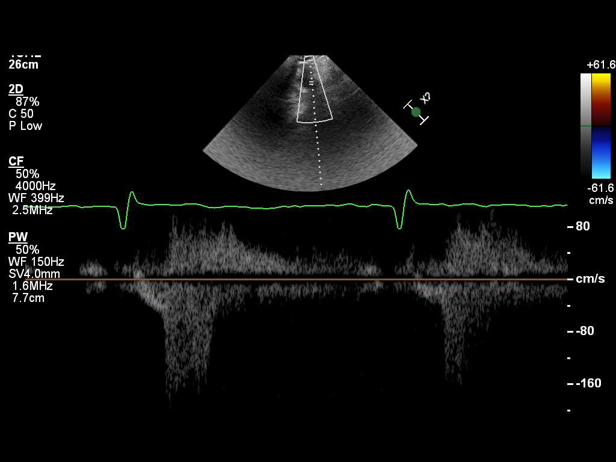
[im 98/119]
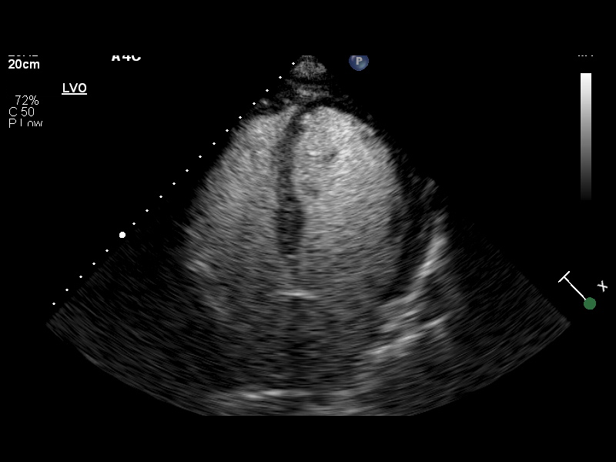
[im 103/119]
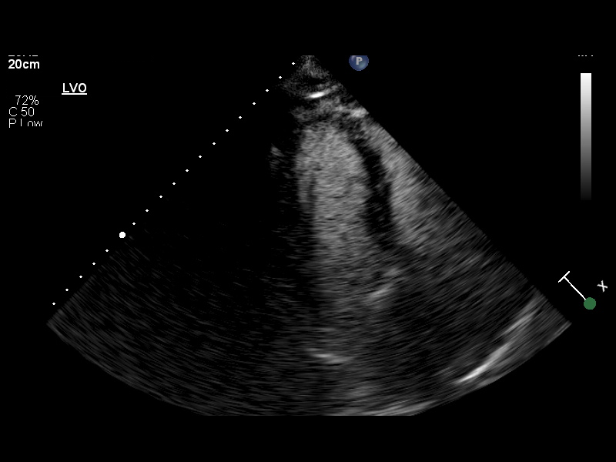
[im 108/119]
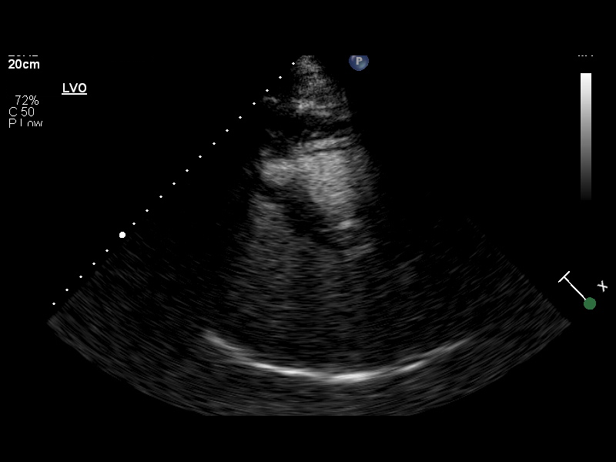
[im 119/119]
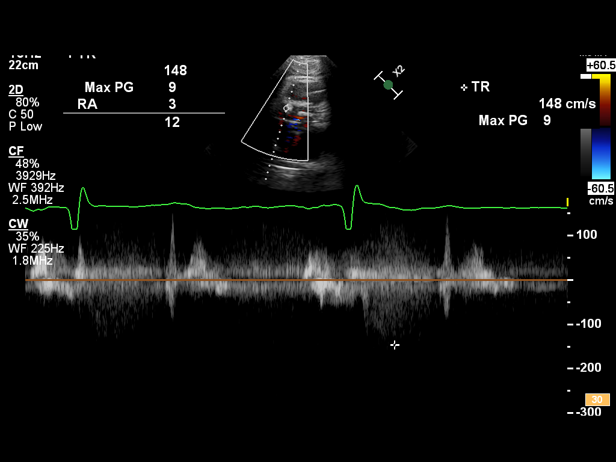

[16 of 24 positions shown; findings below may reference images not displayed]

FINDINGS: Study Details:
Technically difficult study due to limited acoustic windows.
Left Ventricle:
Mildly dilated left ventricle. Normal left ventricular systolic function. LV Ejection Fraction was
70 %. The left ventricular ejection fraction was calculated using the biplane Tess`Pitoirizarry rule
method. Mild left ventricular hypertrophy. Left ventricular diastolic parameters were normal.
Resting Segmental Wall Motion Analysis:
Total wall motion score is 1.00. There are no regional wall motion abnormalities.
Right Ventricle:
Normal right ventricular size.
Left Atrium:
The left atrium is normal in size.
Right Atrium:
The right atrium is normal in size.
Atrial Septum:
The interatrial septum is normal in appearance.
Mitral Valve:
Mitral valve is not well visualized. There is mild to moderate mitral valve regurgitation.
Aortic Valve:
Structurally normal, trileaflet aortic valve. Aortic cusps appear severely calcified. Aortic cusps
appear moderately restricted. The peak transaortic velocity was 523 cm/sec. The mean transaortic
gradient was 61 mmHg. The aortic valve area by the continuity equation (using VTI) was 0.53 cm2. The
aortic valve area indexed to the BSA is 0.21 cm2/m2. There is mild aortic valve regurgitation.
Tricuspid Valve:
There is trace tricuspid regurgitation. Estimated peak RVSP is 12 mmHg.
Pulmonic Valve:
Pulmonic valve not well visualized. There is moderate pulmonic regurgitation.
Pericardium:
No significant pericardial effusion.
Aorta:
Sinus of Valsalva: 4.0 cm. Normal range for aortic root on the basis of BSA is 3.0 cm. to
cm. Ascending Aorta 4.0 cm.
IVC:
The inferior vena cava is of normal size. The IVC diameter was 19 mm. The inferior vena cava shows a
normal respiratory collapse consistent with normal right atrial pressure (3 mmHg).
Pulmonary Artery:
Not well visualized.
Pulmonary Veins:
Pulse Doppler interrogation shows normal systolic predominant flow.
-
CONCLUSIONS:
1. Technically difficult study due to limited acoustic windows.
2. Mildly dilated left ventricle. Normal left ventricular systolic function. LV Ejection Fraction
was 70 %. The left ventricular ejection fraction was calculated using the biplane Tess`Pitoirizarry rule
method. Mild left ventricular hypertrophy. Left ventricular diastolic parameters were normal.
3. The left atrium is normal in size.
4. Mitral valve is not well visualized. There is mild to moderate mitral valve regurgitation.
5. Structurally normal, trileaflet aortic valve. Aortic cusps appear severely calcified. Aortic
cusps appear moderately restricted. The peak transaortic velocity was 523 cm/sec. The mean
transaortic gradient was 61 mmHg. The aortic valve area by the continuity equation (using VTI) was
0.53 cm2. The aortic valve area indexed to the BSA is 0.21 cm2/m2. There is mild aortic valve
regurgitation.
6. There is trace tricuspid regurgitation. Estimated peak RVSP is 12 mmHg.
7. No significant pericardial effusion.
8. The inferior vena cava is of normal size. The IVC diameter was 19 mm. The inferior vena cava
shows a normal respiratory collapse consistent with normal right atrial pressure (3 mmHg).

Tech Notes:

DEFINITY GIVEN LOT#[id number]

## 2023-05-15 ENCOUNTER — Encounter: Admit: 2023-05-15 | Discharge: 2023-05-15 | Payer: MEDICARE

## 2023-05-15 NOTE — Telephone Encounter
-----   Message from Baxter Limber, MD sent at 05/15/2023  1:27 PM CDT -----  [facility], can you please let Robert Butler know that his stress test looks fine?    Cc:  Dr. Arthur Lash

## 2023-05-15 NOTE — Telephone Encounter
 Discussed treadmill only test results with patient. Patient verbalizes understanding and does not have any further questions or concerns at this time.

## 2023-10-23 ENCOUNTER — Encounter: Admit: 2023-10-23 | Discharge: 2023-10-23 | Payer: MEDICARE

## 2023-10-23 DIAGNOSIS — I2510 Atherosclerotic heart disease of native coronary artery without angina pectoris: Principal | ICD-10-CM

## 2023-10-28 ENCOUNTER — Encounter: Admit: 2023-10-28 | Discharge: 2023-10-28 | Payer: MEDICARE

## 2023-10-28 ENCOUNTER — Ambulatory Visit: Admit: 2023-10-28 | Discharge: 2023-10-28 | Payer: MEDICARE

## 2023-10-28 VITALS — BP 127/77 | HR 51 | Ht 70.0 in | Wt 300.2 lb

## 2023-10-28 DIAGNOSIS — I2510 Atherosclerotic heart disease of native coronary artery without angina pectoris: Secondary | ICD-10-CM

## 2023-10-28 DIAGNOSIS — I10 Essential (primary) hypertension: Secondary | ICD-10-CM

## 2023-10-28 DIAGNOSIS — E7800 Pure hypercholesterolemia, unspecified: Principal | ICD-10-CM

## 2023-10-28 DIAGNOSIS — Z952 Presence of prosthetic heart valve: Secondary | ICD-10-CM

## 2023-10-28 MED ORDER — AMOXICILLIN 500 MG PO CAP
ORAL_CAPSULE | ORAL | 1 refills | 7.00000 days | Status: AC
Start: 2023-10-28 — End: ?

## 2023-10-28 NOTE — Assessment & Plan Note
 Lab Results   Component Value Date    CHOL 144 11/28/2022    TRIG 96 11/28/2022    HDL 40 11/28/2022    LDL 85 11/28/2022    VLDL 19 11/28/2022    NONHDLCHOL 94 03/14/2021    CHOLHDLC 4 11/28/2022      Rosuva 20/day.

## 2023-10-28 NOTE — Assessment & Plan Note
He takes amlodipine 5 mg, hydrochlorothiazide 25 mg, and metoprolol XL 25 mg daily.  Goal for blood pressure is 130/80 and he appears to be treated to goal.

## 2023-10-28 NOTE — Progress Notes
 Date of Service: 10/28/2023    Robert Butler is a 68 y.o. male.       HPI     Robert Butler was in the [facility] clinic for follow-up regarding TAVR and CAD.  He underwent TAVR done at [place] back in March, 2023.  He required coronary stenting (RCA and LAD) 1 month previously for clinically silent coronary disease.     His last echocardiogram was a few months ago and he has a mild paravalvular leak that should not amount anything into the future.     He sees his dentist regularly and uses antibiotics appropriately for endocarditis prophylaxis.     He is still participating in cardiac rehab here at [facility] and those sessions seem to be going well.     He denies any problems with exertional chest discomfort or breathlessness.  He has had no palpitations, syncope, or near syncope.  He denies any TIA or stroke symptoms.          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   HENT: Negative.     Eyes: Negative.    Cardiovascular: Negative.    Respiratory: Negative.     Endocrine: Negative.    Hematologic/Lymphatic: Negative.    Skin: Negative.    Musculoskeletal: Negative.    Gastrointestinal: Negative.    Genitourinary: Negative.    Neurological: Negative.    Psychiatric/Behavioral: Negative.     Allergic/Immunologic: Negative. Physical Exam    Physical Exam   General Appearance: no distress   Skin: warm, no ulcers or xanthomas   Digits and Nails: no cyanosis or clubbing   Eyes: conjunctivae and lids normal, pupils are equal and round   Teeth/Gums/Palate: dentition unremarkable, no lesions   Lips & Oral Mucosa: no pallor or cyanosis   Neck Veins: normal JVP , neck veins are not distended   Thyroid: no nodules, masses, tenderness or enlargement   Chest Inspection: chest is normal in appearance   Respiratory Effort: breathing comfortably, no respiratory distress   Auscultation/Percussion: lungs clear to auscultation, no rales or rhonchi, no wheezing   PMI: PMI not enlarged or displaced   Cardiac Rhythm: regular rhythm and normal rate   Cardiac Auscultation: S1, S2 normal, no rub, no gallop   Murmurs: no murmur   Peripheral Circulation: normal peripheral circulation   Carotid Arteries: normal carotid upstroke bilaterally, no bruits   Radial Arteries: normal symmetric radial pulses   Abdominal Aorta: no abdominal aortic bruit   Pedal Pulses: normal symmetric pedal pulses   Lower Extremity Edema: no lower extremity edema  Abdominal Exam: soft, non-tender, no masses, bowel sounds normal   Liver & Spleen: no organomegaly   Gait & Station: walks without assistance   Muscle Strength: normal muscle tone   Orientation: oriented to time, place and person   Affect & Mood: appropriate and sustained affect   Language and Memory: patient responsive and seems to comprehend information   Neurologic Exam: neurological assessment grossly intact   Other: moves all extremities      Cardiovascular Health Factors  Vitals BP Readings from Last 3 Encounters:   10/28/23 127/77   05/09/23 (!) 147/77   11/05/22 125/74     Wt Readings from Last 3 Encounters:   10/28/23 (!) 136.2 kg (300 lb 3.2 oz)   05/09/23 130.4 kg (287 lb 6.4 oz)   11/05/22 126.9 kg (279 lb 12.8 oz)     BMI Readings from Last 3 Encounters:   10/28/23 43.07 kg/m?   05/09/23 41.24 kg/m? 11/05/22 40.15 kg/m?      Smoking Tobacco Use History[1]   Lipid Profile Cholesterol   Date Value Ref Range Status   11/28/2022 144  Final     HDL   Date Value Ref Range Status   11/28/2022 40  Final     LDL   Date Value Ref Range Status   11/28/2022 85  Final     Triglycerides   Date Value Ref Range Status   11/28/2022 96  Final      Blood Sugar Hemoglobin A1C   Date Value Ref Range Status   04/11/2021 5.8 (H) 4.0 - 5.7 % Final     Comment:     The ADA recommends that most patients with type 1 and type 2 diabetes maintain   an A1c level <7%.       Glucose   Date Value Ref Range Status   11/28/2022 94  Final   05/28/2022 99  Final   04/11/2022 87 70 - 100 MG/DL Final          Problems Addressed Today  Encounter Diagnoses   Name Primary?    Hypercholesterolemia Yes    Coronary artery disease involving native coronary artery of native heart without angina pectoris     Primary hypertension     S/P TAVR (transcatheter aortic valve replacement)        Assessment and Plan       Hypercholesterolemia  Lab Results   Component Value Date    CHOL 144 11/28/2022    TRIG 96 11/28/2022    HDL 40 11/28/2022    LDL 85 11/28/2022    VLDL 19 11/28/2022    NONHDLCHOL 94 03/14/2021    CHOLHDLC 4 11/28/2022      Rosuva 20/day.    Coronary artery disease  Incidental PCI (RCA & LAD) on pre-TAVR angiogram in March, 2023.    He had silent coronary disease and will need surveillance stress testing.  Stress EKG 04/2023 looked normal.    Hypertension  He takes amlodipine  5 mg, hydrochlorothiazide  25 mg, and metoprolol  XL 25 mg daily. Goal for blood pressure is 130/80 and he appears to be treated to goal.     S/P TAVR (transcatheter aortic valve replacement)  TAVR March, 2023.  Last echo 04/2023 showed normal bioprosthetic AV, EF 60%.    Current Medications (including today's revisions)   amLODIPine  (NORVASC ) 5 mg tablet Take one tablet by mouth daily.    amoxicillin  (AMOXIL ) 500 mg capsule 4 capsules about 30 minutes before dental procedures aspirin  EC  81 mg tablet Take one tablet by mouth daily. Take with food.    CHOLEcalciferoL (vitamin D3) (VITAMIN D3) 50 mcg (2,000 unit) tablet Take one tablet by mouth daily.    hydroCHLOROthiazide  (HYDRODIURIL ) 25 mg tablet Take one-half tablet by mouth every morning. Pt told to cut in half    levothyroxine  (SYNTHROID ) 50 mcg tablet Take one tablet by mouth daily 30 minutes before breakfast.    metoprolol  succinate XL (TOPROL  XL) 50 mg extended release tablet Take one tablet by mouth daily.    multivitamin/iron/folic acid (CENTRUM COMPLETE PO) Take  by mouth daily.    rosuvastatin  (CRESTOR ) 20 mg tablet Take one tablet by mouth at bedtime daily.     Total time spent on today's office visit was 45 minutes.  This includes face-to-face in person visit with patient as well as nonface-to-face time including review of the EMR, outside records, labs, radiologic studies, echocardiogram & other cardiovascular studies, formation of treatment plan, after visit summary, future disposition, and lastly on documentation.              [1]   Social History  Tobacco Use   Smoking Status Never   Smokeless Tobacco Never

## 2023-10-28 NOTE — Assessment & Plan Note
 TAVR March, 2023.  Last echo 04/2023 showed normal bioprosthetic AV, EF 60%.

## 2023-10-28 NOTE — Assessment & Plan Note
 Incidental PCI (RCA & LAD) on pre-TAVR angiogram in March, 2023.    He had silent coronary disease and will need surveillance stress testing.  Stress EKG 04/2023 looked normal.

## 2023-10-29 ENCOUNTER — Encounter: Admit: 2023-10-29 | Discharge: 2023-10-29 | Payer: MEDICARE
